# Patient Record
Sex: Male | Born: 1965 | Race: Black or African American | Hispanic: No | Marital: Single | Smoking: Current every day smoker
Health system: Southern US, Community
[De-identification: ages and names within clinical notes are randomized; demographics above are authoritative.]

## PROBLEM LIST (undated history)

## (undated) DIAGNOSIS — M109 Gout, unspecified: Secondary | ICD-10-CM

## (undated) DIAGNOSIS — I10 Essential (primary) hypertension: Secondary | ICD-10-CM

## (undated) HISTORY — PX: TOTAL HIP ARTHROPLASTY: SHX124

## (undated) HISTORY — PX: TOTAL SHOULDER REPLACEMENT: SUR1217

---

## 2018-10-03 ENCOUNTER — Emergency Department
Admission: EM | Admit: 2018-10-03 | Discharge: 2018-10-03 | Disposition: A | Discharge status: Home / Self Care | Payer: BLUE CROSS/BLUE SHIELD | Attending: Emergency Medicine | Admitting: Emergency Medicine

## 2018-10-03 ENCOUNTER — Other Ambulatory Visit: Payer: Self-pay

## 2018-10-03 ENCOUNTER — Encounter: Payer: Self-pay | Admitting: Emergency Medicine

## 2018-10-03 ENCOUNTER — Emergency Department: Payer: BLUE CROSS/BLUE SHIELD

## 2018-10-03 DIAGNOSIS — Z96643 Presence of artificial hip joint, bilateral: Secondary | ICD-10-CM | POA: Diagnosis not present

## 2018-10-03 DIAGNOSIS — Z96611 Presence of right artificial shoulder joint: Secondary | ICD-10-CM | POA: Diagnosis not present

## 2018-10-03 DIAGNOSIS — I10 Essential (primary) hypertension: Secondary | ICD-10-CM | POA: Insufficient documentation

## 2018-10-03 DIAGNOSIS — M79642 Pain in left hand: Secondary | ICD-10-CM | POA: Diagnosis present

## 2018-10-03 DIAGNOSIS — R2232 Localized swelling, mass and lump, left upper limb: Secondary | ICD-10-CM | POA: Diagnosis not present

## 2018-10-03 DIAGNOSIS — F17200 Nicotine dependence, unspecified, uncomplicated: Secondary | ICD-10-CM | POA: Insufficient documentation

## 2018-10-03 DIAGNOSIS — M7989 Other specified soft tissue disorders: Secondary | ICD-10-CM

## 2018-10-03 HISTORY — DX: Essential (primary) hypertension: I10

## 2018-10-03 LAB — CBC
HCT: 44.2 % (ref 39.0–52.0)
Hemoglobin: 13.9 g/dL (ref 13.0–17.0)
MCH: 27.9 pg (ref 26.0–34.0)
MCHC: 31.4 g/dL (ref 30.0–36.0)
MCV: 88.8 fL (ref 80.0–100.0)
Platelets: 302 10*3/uL (ref 150–400)
RBC: 4.98 MIL/uL (ref 4.22–5.81)
RDW: 13 % (ref 11.5–15.5)
WBC: 10.7 10*3/uL — AB (ref 4.0–10.5)
nRBC: 0 % (ref 0.0–0.2)

## 2018-10-03 LAB — BASIC METABOLIC PANEL
Anion gap: 8 (ref 5–15)
BUN: 15 mg/dL (ref 6–20)
CALCIUM: 9.3 mg/dL (ref 8.9–10.3)
CO2: 30 mmol/L (ref 22–32)
CREATININE: 1.04 mg/dL (ref 0.61–1.24)
Chloride: 100 mmol/L (ref 98–111)
GFR calc non Af Amer: >60 mL/min (ref >60)
Glucose, Bld: 98 mg/dL (ref 70–99)
Potassium: 3.5 mmol/L (ref 3.5–5.1)
SODIUM: 138 mmol/L (ref 135–145)

## 2018-10-03 LAB — URIC ACID: Uric Acid, Serum: 6.6 mg/dL (ref 3.7–8.6)

## 2018-10-03 MED ORDER — OXYCODONE-ACETAMINOPHEN 5-325 MG PO TABS: 1.0000 | ORAL_TABLET | ORAL | 0 refills | Status: DC | PRN

## 2018-10-03 MED ORDER — MORPHINE SULFATE (PF) 4 MG/ML IV SOLN
4.0000 mg | Freq: Once | INTRAVENOUS | Status: AC
  Administered 2018-10-03: 4 mg via INTRAVENOUS
  Filled 2018-10-03: qty 1

## 2018-10-03 MED ORDER — PREDNISONE 10 MG PO TABS: ORAL_TABLET | ORAL | 0 refills | Status: DC

## 2018-10-03 MED ORDER — DEXAMETHASONE SODIUM PHOSPHATE 10 MG/ML IJ SOLN
10.0000 mg | Freq: Once | INTRAMUSCULAR | Status: AC
  Administered 2018-10-03: 10 mg via INTRAVENOUS
  Filled 2018-10-03: qty 1

## 2018-10-03 MED ORDER — OXYCODONE-ACETAMINOPHEN 5-325 MG PO TABS
1.0000 | ORAL_TABLET | Freq: Once | ORAL | Status: AC
  Administered 2018-10-03: 1 via ORAL
  Filled 2018-10-03: qty 1

## 2018-10-03 NOTE — ED Provider Notes (Signed)
[facility] EMERGENCY DEPARTMENT Provider Note   CSN: [id number] Arrival date & time: 10/03/18  1841     History   Chief Complaint Chief Complaint  Patient presents with  . Hand Pain    HPI Thomas Tapia is a 52 y.o. male.  Presents to the emergency department evaluation of left hand pain and swelling 3 days ago.  Patient denies any trauma or injury.  No skin breakdown, wounds.  He states he has a history of gout and hypertension.  He has not been taking his gout medication, allopurinol as it has been prescribed.  Patient feels as if the left wrist and hand are very painful, hot, swollen.  No numbness or tingling.  He has tried restarting his allopurinol with no improvement.  Pain is moderate.  HPI  Past Medical History:  Diagnosis Date  . Hypertension     There are no active problems to display for this patient.   Past Surgical History:  Procedure Laterality Date  . TOTAL HIP ARTHROPLASTY Bilateral   . TOTAL SHOULDER REPLACEMENT Right         Home Medications    Prior to Admission medications   Medication Sig Start Date End Date Taking? Authorizing Provider  oxyCODONE-acetaminophen (PERCOCET) 5-325 MG tablet Take 1 tablet by mouth every 4 (four) hours as needed for severe pain. 10/03/18 10/03/19  Duanne Guess, PA-C  predniSONE (DELTASONE) 10 MG tablet 10 day taper. 5,5,4,4,3,3,2,2,1,1 10/03/18   Duanne Guess, PA-C    Family History No family history on file.  Social History Social History   Tobacco Use  . Smoking status: Current Every Day Smoker  . Smokeless tobacco: Never Used  Substance Use Topics  . Alcohol use: Not on file  . Drug use: Not on file     Allergies   Patient has no known allergies.   Review of Systems Review of Systems  Constitutional: Negative.  Negative for activity change, appetite change, chills and fever.  HENT: Negative for ear pain and trouble swallowing.   Respiratory: Negative for shortness  of breath.   Cardiovascular: Negative for chest pain.  Gastrointestinal: Negative for nausea and vomiting.  Genitourinary: Negative for decreased urine volume.  Musculoskeletal: Positive for arthralgias and joint swelling. Negative for back pain and gait problem.  Skin: Negative for color change, rash and wound.  Neurological: Negative for dizziness and headaches.  Hematological: Negative for adenopathy.  Psychiatric/Behavioral: Negative for agitation and behavioral problems.     Physical Exam Updated Vital Signs BP (!) 149/85   Pulse 96   Temp 97.6 F (36.4 C) (Oral)   Resp 18   Ht 5\' 11"  (1.803 m)   Wt 102.1 kg   SpO2 99%   BMI 31.38 kg/m   Physical Exam  Constitutional: He is oriented to person, place, and time. He appears well-developed and well-nourished.  HENT:  Head: Normocephalic and atraumatic.  Eyes: Conjunctivae are normal.  Neck: Normal range of motion.  Cardiovascular: Normal rate.  Pulmonary/Chest: Effort normal. No respiratory distress.  Musculoskeletal: Normal range of motion.  Left hand and wrist are swollen, erythematous and warm.  No skin breakdown noted.  Wrist and carpal region of the left hand are tender to palpation.  No limitations throughout left upper extremity with digit range of motion.  2+ radial pulse.  Cap refill is intact.  Patient is able to make a full fist.  There is no swelling throughout the digits.  No sign of flexor tenosynovitis.  Neurological: He is alert and oriented to person, place, and time.  Skin: Skin is warm. No rash noted.  Psychiatric: He has a normal mood and affect. His behavior is normal. Thought content normal.  Nursing note and vitals reviewed.    [place] Treatments / Results  Labs (all labs ordered are listed, but only abnormal results are displayed) Labs Reviewed  CBC - Abnormal; Notable for the following components:      Result Value   WBC 10.7 (*)    All other components within normal limits  BASIC METABOLIC PANEL   URIC ACID    EKG None  Radiology Dg Hand Complete Left  Result Date: 10/03/2018 CLINICAL DATA:  Left hand swelling and pain EXAM: LEFT HAND - COMPLETE 3+ VIEW COMPARISON:  None. FINDINGS: Moderate diffuse soft tissue swelling of the dorsum of the wrist and hand. No frank bone destruction to suggest osteomyelitis. No soft tissue mineralization or extra-articular erosive change. 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 Fixation screw traverses the fourth DIP joint. Healed fracture deformity of the head and neck of the left second metacarpal. Osteoarthritic joint space narrowing of the second MCP. Intact carpal rows and carpal bones. Distal radius and ulna are nonacute. Faint lucency at the base of the fourth proximal phalanx is seen. Although this could potentially represent a nondisplaced fracture, patient notes no known injury and therefore findings may be secondary to a trabecular marking. IMPRESSION: 1. Moderate diffuse soft tissue swelling over the dorsum of the hand and wrist without underlying fracture or bone destruction. 2. Faint nondisplaced linear lucency at the base of the fourth proximal phalanx may represent a trabecular marking in the absence of known injury. 3. Remote healed fracture deformity of the second metacarpal head and neck with associated degenerative joint space narrowing of the second MCP. 4. Fixation screw traverses an osteoarthritic fourth DIP joint. No complicating features are noted. There is mild soft tissue swelling of the ring finger however. Electronically Signed   By: Ashley Royalty M.D.   On: 10/03/2018 20:53    Procedures Procedures (including critical care time)  Medications Ordered in [facility] Medications  oxyCODONE-acetaminophen (PERCOCET/ROXICET) 5-325 MG per tablet 1 tablet (has no administration in time range)  morphine 4 MG/ML injection 4 mg (4 mg Intravenous Given 10/03/18 2037)  dexamethasone (DECADRON) injection 10 mg (10 mg  Intravenous Given 10/03/18 2153)     Initial Impression / Assessment and Plan / Fitz Course  I have reviewed the triage vital signs and the nursing notes.  Pertinent labs & imaging results that were available during my care of the patient were reviewed by me and considered in my medical decision making (see chart for details).     52 year old male with a history of gout presents to emergency department with swollen red tender left hand.  History and exam concerning for gout, uric acid within normal limits, this could be due to patient restarting his allopurinol over the last few days.  He is not running fevers and his white count is only slightly elevated.  No sign of flexor tenosynovitis, abscess formation or osteomyelitis.  History and exam is more concerning for gout's will start prednisone and give oxycodone.  He is educated on signs and symptoms return to the [facility] for such as no improvement of redness or swelling in the next 24 to 48 hours as well as any fevers or increasing pain or digit swelling.  Final Clinical Impressions(s) / Marcia Diagnoses   Final  diagnoses:  Left hand pain  Swelling of left hand    [facility] Discharge Orders         Ordered    predniSONE (DELTASONE) 10 MG tablet     10/03/18 2223    oxyCODONE-acetaminophen (PERCOCET) 5-325 MG tablet  Every 4 hours PRN     10/03/18 2223           Renata Caprice 10/03/18 2228    Orbie Pyo, MD 10/03/18 [phone]

## 2018-10-03 NOTE — Discharge Instructions (Addendum)
Please take prednisone as prescribed along with pain medicine as needed.  If any increasing pain, swelling redness or fevers return to the emergency department.

## 2018-10-03 NOTE — ED Triage Notes (Signed)
Patient ambulatory to triage with steady gait, without difficulty or distress noted; pt reports left hand swelling x 3days with no known injury; hx gout

## 2019-01-08 ENCOUNTER — Emergency Department: Payer: Medicare (Managed Care)

## 2019-01-08 ENCOUNTER — Other Ambulatory Visit: Payer: Self-pay

## 2019-01-08 ENCOUNTER — Encounter: Payer: Self-pay | Admitting: *Deleted

## 2019-01-08 ENCOUNTER — Emergency Department
Admission: EM | Admit: 2019-01-08 | Discharge: 2019-01-08 | Disposition: A | Discharge status: Home / Self Care | Payer: Medicare (Managed Care) | Attending: Emergency Medicine | Admitting: Emergency Medicine

## 2019-01-08 DIAGNOSIS — Z96643 Presence of artificial hip joint, bilateral: Secondary | ICD-10-CM | POA: Diagnosis not present

## 2019-01-08 DIAGNOSIS — M79671 Pain in right foot: Secondary | ICD-10-CM | POA: Diagnosis present

## 2019-01-08 DIAGNOSIS — L03115 Cellulitis of right lower limb: Secondary | ICD-10-CM

## 2019-01-08 DIAGNOSIS — I10 Essential (primary) hypertension: Secondary | ICD-10-CM | POA: Diagnosis not present

## 2019-01-08 DIAGNOSIS — Z79899 Other long term (current) drug therapy: Secondary | ICD-10-CM | POA: Diagnosis not present

## 2019-01-08 DIAGNOSIS — M1000 Idiopathic gout, unspecified site: Secondary | ICD-10-CM | POA: Insufficient documentation

## 2019-01-08 DIAGNOSIS — Z96611 Presence of right artificial shoulder joint: Secondary | ICD-10-CM | POA: Diagnosis not present

## 2019-01-08 DIAGNOSIS — F17210 Nicotine dependence, cigarettes, uncomplicated: Secondary | ICD-10-CM | POA: Insufficient documentation

## 2019-01-08 LAB — CBC WITH DIFFERENTIAL/PLATELET
Abs Immature Granulocytes: 0.04 10*3/uL (ref 0.00–0.07)
BASOS ABS: 0.1 10*3/uL (ref 0.0–0.1)
BASOS PCT: 1 %
EOS ABS: 0.2 10*3/uL (ref 0.0–0.5)
EOS PCT: 2 %
HCT: 41.6 % (ref 39.0–52.0)
Hemoglobin: 13.2 g/dL (ref 13.0–17.0)
Immature Granulocytes: 0 %
LYMPHS PCT: 12 %
Lymphs Abs: 1.3 10*3/uL (ref 0.7–4.0)
MCH: 27.8 pg (ref 26.0–34.0)
MCHC: 31.7 g/dL (ref 30.0–36.0)
MCV: 87.8 fL (ref 80.0–100.0)
MONO ABS: 1.5 10*3/uL — AB (ref 0.1–1.0)
Monocytes Relative: 15 %
NRBC: 0 % (ref 0.0–0.2)
Neutro Abs: 7.3 10*3/uL (ref 1.7–7.7)
Neutrophils Relative %: 70 %
PLATELETS: 304 10*3/uL (ref 150–400)
RBC: 4.74 MIL/uL (ref 4.22–5.81)
RDW: 13 % (ref 11.5–15.5)
WBC: 10.3 10*3/uL (ref 4.0–10.5)

## 2019-01-08 LAB — COMPREHENSIVE METABOLIC PANEL
ALT: 85 U/L — AB (ref 0–44)
AST: 58 U/L — ABNORMAL HIGH (ref 15–41)
Albumin: 3.9 g/dL (ref 3.5–5.0)
Alkaline Phosphatase: 72 U/L (ref 38–126)
Anion gap: 8 (ref 5–15)
BILIRUBIN TOTAL: 0.8 mg/dL (ref 0.3–1.2)
BUN: 14 mg/dL (ref 6–20)
CHLORIDE: 97 mmol/L — AB (ref 98–111)
CO2: 28 mmol/L (ref 22–32)
CREATININE: 1.03 mg/dL (ref 0.61–1.24)
Calcium: 8.9 mg/dL (ref 8.9–10.3)
Glucose, Bld: 117 mg/dL — ABNORMAL HIGH (ref 70–99)
Potassium: 3.4 mmol/L — ABNORMAL LOW (ref 3.5–5.1)
Sodium: 133 mmol/L — ABNORMAL LOW (ref 135–145)
TOTAL PROTEIN: 8.1 g/dL (ref 6.5–8.1)

## 2019-01-08 LAB — URIC ACID: URIC ACID, SERUM: 7.7 mg/dL (ref 3.7–8.6)

## 2019-01-08 MED ORDER — MORPHINE SULFATE (PF) 4 MG/ML IV SOLN
4.0000 mg | Freq: Once | INTRAVENOUS | Status: AC
  Administered 2019-01-08: 4 mg via INTRAVENOUS
  Filled 2019-01-08: qty 1

## 2019-01-08 MED ORDER — SULFAMETHOXAZOLE-TRIMETHOPRIM 800-160 MG PO TABS: 1.0000 | ORAL_TABLET | Freq: Two times a day (BID) | ORAL | 0 refills | Status: DC

## 2019-01-08 MED ORDER — HYDROCODONE-ACETAMINOPHEN 5-325 MG PO TABS: 1.0000 | ORAL_TABLET | Freq: Four times a day (QID) | ORAL | 0 refills | Status: DC | PRN

## 2019-01-08 MED ORDER — DEXAMETHASONE SODIUM PHOSPHATE 10 MG/ML IJ SOLN
10.0000 mg | Freq: Once | INTRAMUSCULAR | Status: AC
  Administered 2019-01-08: 10 mg via INTRAVENOUS
  Filled 2019-01-08: qty 1

## 2019-01-08 MED ORDER — PREDNISONE 10 MG (21) PO TBPK: ORAL_TABLET | ORAL | 0 refills | Status: DC

## 2019-01-08 MED ORDER — CEPHALEXIN 500 MG PO CAPS: 500.0000 mg | ORAL_CAPSULE | Freq: Three times a day (TID) | ORAL | 0 refills | Status: DC

## 2019-01-08 MED ORDER — PIPERACILLIN-TAZOBACTAM 3.375 G IVPB 30 MIN
3.3750 g | Freq: Once | INTRAVENOUS | Status: AC
  Administered 2019-01-08: 3.375 g via INTRAVENOUS
  Filled 2019-01-08: qty 50

## 2019-01-08 MED ORDER — COLCHICINE 0.6 MG PO TABS: 0.6000 mg | ORAL_TABLET | Freq: Every day | ORAL | 2 refills | Status: DC

## 2019-01-08 MED ORDER — ONDANSETRON HCL 4 MG/2ML IJ SOLN
4.0000 mg | Freq: Once | INTRAMUSCULAR | Status: AC
  Administered 2019-01-08: 4 mg via INTRAVENOUS
  Filled 2019-01-08: qty 2

## 2019-01-08 NOTE — ED Provider Notes (Signed)
[facility] Emergency Department Provider Note  ____________________________________________   First MD Initiated Contact with Patient 01/08/19 1343     (approximate)  I have reviewed the triage vital signs and the nursing notes.   HISTORY  Chief Complaint Foot Pain    HPI Thomas Tapia is a 53 y.o. male presents emergency department complaint of right foot pain.  He has history of gout.  He recently has been drinking a lot of beer and eating sausages.  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  He states that he had to have surgery on the foot the last time it got infected.  He denies fever chills this time.    Past Medical History:  Diagnosis Date  . Hypertension     There are no active problems to display for this patient.   Past Surgical History:  Procedure Laterality Date  . TOTAL HIP ARTHROPLASTY Bilateral   . TOTAL SHOULDER REPLACEMENT Right     Prior to Admission medications   Medication Sig Start Date End Date Taking? Authorizing Provider  cephALEXin (KEFLEX) 500 MG capsule Take 1 capsule (500 mg total) by mouth 3 (three) times daily. 01/08/19   Fisher, Linden Dolin, PA-C  colchicine 0.6 MG tablet Take 1 tablet (0.6 mg total) by mouth daily. 01/08/19 01/08/20  Fisher, Linden Dolin, PA-C  HYDROcodone-acetaminophen (NORCO/VICODIN) 5-325 MG tablet Take 1 tablet by mouth every 6 (six) hours as needed for moderate pain. 01/08/19   Fisher, Linden Dolin, PA-C  oxyCODONE-acetaminophen (PERCOCET) 5-325 MG tablet Take 1 tablet by mouth every 4 (four) hours as needed for severe pain. 10/03/18 10/03/19  Duanne Guess, PA-C  predniSONE (STERAPRED UNI-PAK 21 TAB) 10 MG (21) TBPK tablet Take 6 pills on day one then decrease by 1 pill each day 01/08/19   Versie Starks, PA-C  sulfamethoxazole-trimethoprim (BACTRIM DS,SEPTRA DS) 800-160 MG tablet Take 1 tablet by mouth 2 (two) times daily. 01/08/19   Versie Starks, PA-C    Allergies Patient has no known  allergies.  History reviewed. No pertinent family history.  Social History Social History   Tobacco Use  . Smoking status: Current Every Day Smoker  . Smokeless tobacco: Never Used  Substance Use Topics  . Alcohol use: Not on file  . Drug use: Not on file    Review of Systems  Constitutional: No fever/chills Eyes: No visual changes. ENT: No sore throat. Respiratory: Denies cough Genitourinary: Negative for dysuria. Musculoskeletal: Negative for back pain.  Right foot pain Skin: Negative for rash.    ____________________________________________   PHYSICAL EXAM:  VITAL SIGNS: [place] Triage Vitals  Enc Vitals Group     BP 01/08/19 1252 (!) 164/91     Pulse Rate 01/08/19 1252 92     Resp 01/08/19 1252 14     Temp 01/08/19 1252 97.7 F (36.5 C)     Temp Source 01/08/19 1252 Oral     SpO2 01/08/19 1252 92 %     Weight 01/08/19 1252 225 lb (102.1 kg)     Height 01/08/19 1252 5\' 11"  (1.803 m)     Head Circumference --      Peak Flow --      Pain Score 01/08/19 1258 10     Pain Loc --      Pain Edu? --      Excl. in [place]? --     Constitutional: Alert and oriented. Well appearing and in no acute distress. Eyes: Conjunctivae  are normal.  Head: Atraumatic. Nose: No congestion/rhinnorhea. Mouth/Throat: Mucous membranes are moist.   Neck:  supple no lymphadenopathy noted Cardiovascular: Normal rate, regular rhythm. Heart sounds are normal Respiratory: Normal respiratory effort.  No retractions, lungs c t a  GU: deferred Musculoskeletal: FROM all extremities, warm and well perfused, the right foot is grossly red and swollen, tender to palpation.  Neurovascular is intact.  The skin on the plantar surface has tinea, the area does appear to have broken skin. Neurologic:  Normal speech and language.  Skin:  Skin is warm, dry . No rash noted. Psychiatric: Mood and affect are normal. Speech and behavior are normal.  ____________________________________________   LABS (all  labs ordered are listed, but only abnormal results are displayed)  Labs Reviewed  COMPREHENSIVE METABOLIC PANEL - Abnormal; Notable for the following components:      Result Value   Sodium 133 (*)    Potassium 3.4 (*)    Chloride 97 (*)    Glucose, Bld 117 (*)    AST 58 (*)    ALT 85 (*)    All other components within normal limits  CBC WITH DIFFERENTIAL/PLATELET - Abnormal; Notable for the following components:   Monocytes Absolute 1.5 (*)    All other components within normal limits  URIC ACID   ____________________________________________   ____________________________________________  RADIOLOGY  X-ray of the right foot is negative  ____________________________________________   PROCEDURES  Procedure(s) performed: Saline lock, morphine 4 mg IV, Zofran 4 mg IV, Decadron 10 mg IV, Zosyn IV  Procedures    ____________________________________________   INITIAL IMPRESSION / ASSESSMENT AND PLAN / Yvan COURSE  Pertinent labs & imaging results that were available during my care of the patient were reviewed by me and considered in my medical decision making (see chart for details).   Patient is a 53 year old male presents emergency department complaint of right foot pain.  Physical exam shows a red swollen tender foot.  Labs for CBC, uric acid, comprehensive metabolic panel are basically normal.  However he does have slightly elevated liver enzymes which I would attribute to his alcohol intake.  X-ray of the right foot  Due to the patient's history of MRSA he was given Zosyn IV.  He had originally been given Decadron 10 mg IV, morphine 4 mg IV and Zofran 4 mg IV.  States pain medication has helped with his pain.  Explained all of the labs and x-ray results to the patient.   He is given a prescription for Septra/Keflex for the cellulitis, colchicine for his chronic gout, Sterapred, and Vicodin.  He is to follow-up with his regular doctor if not better in 3 to 5 days.   Return emergency department worsening.  States he understands will comply.  He was discharged in stable condition.  As part of my medical decision making, I reviewed the following data within the [place] notes reviewed and incorporated, Labs reviewed CBC is normal, uric acid is normal, comprehensive metabolic panel has some elevated liver enzymes but is otherwise normal, Old chart reviewed, Radiograph reviewed x-ray of the right foot is negative for osteomyelitis, Notes from prior [place] visits and [place] Controlled Substance Database  ____________________________________________   FINAL CLINICAL IMPRESSION(S) / Darold DIAGNOSES  Final diagnoses:  Cellulitis of right lower extremity  Acute idiopathic gout, unspecified site      NEW MEDICATIONS STARTED DURING THIS VISIT:  New Prescriptions   CEPHALEXIN (KEFLEX) 500 MG CAPSULE    Take 1 capsule (  500 mg total) by mouth 3 (three) times daily.   COLCHICINE 0.6 MG TABLET    Take 1 tablet (0.6 mg total) by mouth daily.   HYDROCODONE-ACETAMINOPHEN (NORCO/VICODIN) 5-325 MG TABLET    Take 1 tablet by mouth every 6 (six) hours as needed for moderate pain.   PREDNISONE (STERAPRED UNI-PAK 21 TAB) 10 MG (21) TBPK TABLET    Take 6 pills on day one then decrease by 1 pill each day   SULFAMETHOXAZOLE-TRIMETHOPRIM (BACTRIM DS,SEPTRA DS) 800-160 MG TABLET    Take 1 tablet by mouth 2 (two) times daily.     Note:  This document was prepared using Dragon voice recognition software and may include unintentional dictation errors.    Versie Starks, PA-C 01/08/19 1552    Nance Pear, MD 01/09/19 [phone]

## 2019-01-08 NOTE — Discharge Instructions (Addendum)
After if not better in 3 to 5 days.  Return emergency department worsening.  Take medications as prescribed.  You are being treated for cellulitis and gout.  You should change her lifestyle which she did not drink as much alcohol and do not eat ground meat.  Drink plenty of water to help flush this through your system.  Return if worsening

## 2019-01-08 NOTE — ED Triage Notes (Signed)
Pt to Thomas Tapia reporting right foot pain since Monday. Hx of Gout but pt reporting this feels worse than before. No new injury. Pain worsens when pt applies pressure to the right foot.

## 2019-04-24 ENCOUNTER — Inpatient Hospital Stay
Admission: EM | Admit: 2019-04-24 | Discharge: 2019-04-26 | DRG: 683 | Disposition: A | Discharge status: Home / Self Care | Payer: Medicare (Managed Care) | Attending: Internal Medicine | Admitting: Internal Medicine

## 2019-04-24 ENCOUNTER — Encounter: Payer: Self-pay | Admitting: Emergency Medicine

## 2019-04-24 ENCOUNTER — Emergency Department: Payer: Medicare (Managed Care)

## 2019-04-24 ENCOUNTER — Other Ambulatory Visit: Payer: Self-pay

## 2019-04-24 DIAGNOSIS — M109 Gout, unspecified: Secondary | ICD-10-CM | POA: Diagnosis not present

## 2019-04-24 DIAGNOSIS — Z1159 Encounter for screening for other viral diseases: Secondary | ICD-10-CM | POA: Diagnosis not present

## 2019-04-24 DIAGNOSIS — E860 Dehydration: Secondary | ICD-10-CM | POA: Diagnosis present

## 2019-04-24 DIAGNOSIS — F17200 Nicotine dependence, unspecified, uncomplicated: Secondary | ICD-10-CM | POA: Diagnosis present

## 2019-04-24 DIAGNOSIS — Z8249 Family history of ischemic heart disease and other diseases of the circulatory system: Secondary | ICD-10-CM

## 2019-04-24 DIAGNOSIS — I471 Supraventricular tachycardia: Secondary | ICD-10-CM | POA: Diagnosis present

## 2019-04-24 DIAGNOSIS — I10 Essential (primary) hypertension: Secondary | ICD-10-CM | POA: Diagnosis present

## 2019-04-24 DIAGNOSIS — I160 Hypertensive urgency: Secondary | ICD-10-CM | POA: Diagnosis present

## 2019-04-24 DIAGNOSIS — M6282 Rhabdomyolysis: Secondary | ICD-10-CM | POA: Diagnosis present

## 2019-04-24 DIAGNOSIS — N179 Acute kidney failure, unspecified: Secondary | ICD-10-CM | POA: Diagnosis not present

## 2019-04-24 DIAGNOSIS — F10239 Alcohol dependence with withdrawal, unspecified: Secondary | ICD-10-CM | POA: Diagnosis present

## 2019-04-24 DIAGNOSIS — F10939 Alcohol use, unspecified with withdrawal, unspecified: Secondary | ICD-10-CM

## 2019-04-24 DIAGNOSIS — Z96611 Presence of right artificial shoulder joint: Secondary | ICD-10-CM | POA: Diagnosis present

## 2019-04-24 DIAGNOSIS — Z96643 Presence of artificial hip joint, bilateral: Secondary | ICD-10-CM | POA: Diagnosis present

## 2019-04-24 HISTORY — DX: Gout, unspecified: M10.9

## 2019-04-24 LAB — CBC
HCT: 45 % (ref 39.0–52.0)
Hemoglobin: 15 g/dL (ref 13.0–17.0)
MCH: 28.6 pg (ref 26.0–34.0)
MCHC: 33.3 g/dL (ref 30.0–36.0)
MCV: 85.9 fL (ref 80.0–100.0)
Platelets: 231 10*3/uL (ref 150–400)
RBC: 5.24 MIL/uL (ref 4.22–5.81)
RDW: 13.3 % (ref 11.5–15.5)
WBC: 10 10*3/uL (ref 4.0–10.5)
nRBC: 0 % (ref 0.0–0.2)

## 2019-04-24 LAB — COMPREHENSIVE METABOLIC PANEL
ALT: 42 U/L (ref 0–44)
AST: 54 U/L — ABNORMAL HIGH (ref 15–41)
Albumin: 4.5 g/dL (ref 3.5–5.0)
Alkaline Phosphatase: 65 U/L (ref 38–126)
Anion gap: 15 (ref 5–15)
BUN: 19 mg/dL (ref 6–20)
CO2: 25 mmol/L (ref 22–32)
Calcium: 9.1 mg/dL (ref 8.9–10.3)
Chloride: 89 mmol/L — ABNORMAL LOW (ref 98–111)
Creatinine, Ser: 2.1 mg/dL — ABNORMAL HIGH (ref 0.61–1.24)
GFR calc Af Amer: 40 mL/min — ABNORMAL LOW (ref >60)
GFR calc non Af Amer: 35 mL/min — ABNORMAL LOW (ref >60)
Glucose, Bld: 115 mg/dL — ABNORMAL HIGH (ref 70–99)
Potassium: 3.2 mmol/L — ABNORMAL LOW (ref 3.5–5.1)
Sodium: 129 mmol/L — ABNORMAL LOW (ref 135–145)
Total Bilirubin: 1.6 mg/dL — ABNORMAL HIGH (ref 0.3–1.2)
Total Protein: 9.7 g/dL — ABNORMAL HIGH (ref 6.5–8.1)

## 2019-04-24 LAB — URIC ACID: Uric Acid, Serum: 12.7 mg/dL — ABNORMAL HIGH (ref 3.7–8.6)

## 2019-04-24 LAB — CK: Total CK: 560 U/L — ABNORMAL HIGH (ref 49–397)

## 2019-04-24 LAB — SARS CORONAVIRUS 2 BY RT PCR (HOSPITAL ORDER, PERFORMED IN ~~LOC~~ HOSPITAL LAB): SARS Coronavirus 2: NEGATIVE

## 2019-04-24 LAB — TROPONIN I: Troponin I: <0.03 ng/mL (ref <0.03)

## 2019-04-24 MED ORDER — PREDNISONE 50 MG PO TABS
50.0000 mg | ORAL_TABLET | Freq: Every day | ORAL | Status: DC
  Administered 2019-04-25 – 2019-04-26 (×2): 50 mg via ORAL
  Filled 2019-04-24 (×2): qty 1

## 2019-04-24 MED ORDER — ADENOSINE 6 MG/2ML IV SOLN
6.0000 mg | Freq: Once | INTRAVENOUS | Status: AC
  Administered 2019-04-24: 6 mg via INTRAVENOUS

## 2019-04-24 MED ORDER — MORPHINE SULFATE (PF) 4 MG/ML IV SOLN
4.0000 mg | INTRAVENOUS | Status: DC | PRN
  Filled 2019-04-24: qty 1

## 2019-04-24 MED ORDER — THIAMINE HCL 100 MG/ML IJ SOLN
100.0000 mg | Freq: Every day | INTRAMUSCULAR | Status: DC
  Administered 2019-04-24: 100 mg via INTRAVENOUS
  Filled 2019-04-24: qty 2

## 2019-04-24 MED ORDER — NICOTINE 21 MG/24HR TD PT24
21.0000 mg | MEDICATED_PATCH | Freq: Once | TRANSDERMAL | Status: AC
  Administered 2019-04-24: 21 mg via TRANSDERMAL
  Filled 2019-04-24: qty 1

## 2019-04-24 MED ORDER — ONDANSETRON HCL 4 MG/2ML IJ SOLN
4.0000 mg | Freq: Once | INTRAMUSCULAR | Status: AC
  Administered 2019-04-24: 4 mg via INTRAVENOUS
  Filled 2019-04-24: qty 2

## 2019-04-24 MED ORDER — FOLIC ACID 1 MG PO TABS
1.0000 mg | ORAL_TABLET | Freq: Every day | ORAL | Status: DC
  Administered 2019-04-24 – 2019-04-26 (×3): 1 mg via ORAL
  Filled 2019-04-24 (×3): qty 1

## 2019-04-24 MED ORDER — HYDROCODONE-ACETAMINOPHEN 5-325 MG PO TABS
1.0000 | ORAL_TABLET | ORAL | Status: DC | PRN
  Administered 2019-04-24 – 2019-04-25 (×5): 2 via ORAL
  Filled 2019-04-24 (×5): qty 2

## 2019-04-24 MED ORDER — METOPROLOL TARTRATE 5 MG/5ML IV SOLN
INTRAVENOUS | Status: AC
  Administered 2019-04-24: 5 mg via INTRAVENOUS
  Filled 2019-04-24: qty 5

## 2019-04-24 MED ORDER — LORAZEPAM 2 MG PO TABS: 0.0000 mg | ORAL_TABLET | Freq: Two times a day (BID) | ORAL | Status: DC

## 2019-04-24 MED ORDER — MAGNESIUM SULFATE 2 GM/50ML IV SOLN
2.0000 g | Freq: Once | INTRAVENOUS | Status: AC
  Administered 2019-04-24: 11:00:00 2 g via INTRAVENOUS

## 2019-04-24 MED ORDER — ADENOSINE 6 MG/2ML IV SOLN
INTRAVENOUS | Status: AC
  Administered 2019-04-24: 6 mg via INTRAVENOUS
  Filled 2019-04-24: qty 2

## 2019-04-24 MED ORDER — LORAZEPAM 2 MG/ML IJ SOLN: 1.0000 mg | Freq: Four times a day (QID) | INTRAMUSCULAR | Status: DC | PRN

## 2019-04-24 MED ORDER — SODIUM CHLORIDE 0.9 % IV BOLUS
500.0000 mL | Freq: Once | INTRAVENOUS | Status: AC
  Administered 2019-04-24: 500 mL via INTRAVENOUS

## 2019-04-24 MED ORDER — ACETAMINOPHEN 325 MG PO TABS: 650.0000 mg | ORAL_TABLET | Freq: Four times a day (QID) | ORAL | Status: DC | PRN

## 2019-04-24 MED ORDER — HEPARIN SODIUM (PORCINE) 5000 UNIT/ML IJ SOLN
5000.0000 [IU] | Freq: Three times a day (TID) | INTRAMUSCULAR | Status: DC
  Administered 2019-04-24 – 2019-04-26 (×6): 5000 [IU] via SUBCUTANEOUS
  Filled 2019-04-24 (×7): qty 1

## 2019-04-24 MED ORDER — SODIUM CHLORIDE 0.9 % IV SOLN
INTRAVENOUS | Status: DC
  Administered 2019-04-24 – 2019-04-25 (×3): via INTRAVENOUS

## 2019-04-24 MED ORDER — PREDNISONE 20 MG PO TABS: 50.0000 mg | ORAL_TABLET | Freq: Every day | ORAL | Status: DC

## 2019-04-24 MED ORDER — MAGNESIUM SULFATE 2 GM/50ML IV SOLN
INTRAVENOUS | Status: AC
  Administered 2019-04-24: 2 g via INTRAVENOUS
  Filled 2019-04-24: qty 50

## 2019-04-24 MED ORDER — ADENOSINE 12 MG/4ML IV SOLN
INTRAVENOUS | Status: AC
  Administered 2019-04-24: 10:00:00
  Filled 2019-04-24: qty 4

## 2019-04-24 MED ORDER — ADULT MULTIVITAMIN W/MINERALS CH
1.0000 | ORAL_TABLET | Freq: Every day | ORAL | Status: DC
  Administered 2019-04-24 – 2019-04-26 (×3): 1 via ORAL
  Filled 2019-04-24 (×3): qty 1

## 2019-04-24 MED ORDER — MORPHINE SULFATE (PF) 4 MG/ML IV SOLN
4.0000 mg | INTRAVENOUS | Status: DC | PRN
  Administered 2019-04-24: 4 mg via INTRAVENOUS

## 2019-04-24 MED ORDER — POTASSIUM CHLORIDE CRYS ER 20 MEQ PO TBCR
40.0000 meq | EXTENDED_RELEASE_TABLET | ORAL | Status: AC
  Administered 2019-04-24 (×2): 40 meq via ORAL
  Filled 2019-04-24 (×2): qty 2

## 2019-04-24 MED ORDER — LORAZEPAM 1 MG PO TABS: 1.0000 mg | ORAL_TABLET | Freq: Four times a day (QID) | ORAL | Status: DC | PRN

## 2019-04-24 MED ORDER — ONDANSETRON HCL 4 MG/2ML IJ SOLN: 4.0000 mg | Freq: Four times a day (QID) | INTRAMUSCULAR | Status: DC | PRN

## 2019-04-24 MED ORDER — LORAZEPAM 2 MG PO TABS: 0.0000 mg | ORAL_TABLET | Freq: Four times a day (QID) | ORAL | Status: DC

## 2019-04-24 MED ORDER — LORAZEPAM 2 MG/ML IJ SOLN
0.0000 mg | Freq: Four times a day (QID) | INTRAMUSCULAR | Status: DC
  Administered 2019-04-24: 1 mg via INTRAVENOUS
  Filled 2019-04-24: qty 1

## 2019-04-24 MED ORDER — METOPROLOL TARTRATE 5 MG/5ML IV SOLN
5.0000 mg | Freq: Once | INTRAVENOUS | Status: AC
  Administered 2019-04-24: 5 mg via INTRAVENOUS

## 2019-04-24 MED ORDER — MORPHINE SULFATE (PF) 2 MG/ML IV SOLN
2.0000 mg | Freq: Once | INTRAVENOUS | Status: AC
  Administered 2019-04-25: 2 mg via INTRAVENOUS
  Filled 2019-04-24: qty 1

## 2019-04-24 MED ORDER — THIAMINE HCL 100 MG/ML IJ SOLN
100.0000 mg | Freq: Every day | INTRAMUSCULAR | Status: DC
  Filled 2019-04-24: qty 2

## 2019-04-24 MED ORDER — ONDANSETRON HCL 4 MG PO TABS: 4.0000 mg | ORAL_TABLET | Freq: Four times a day (QID) | ORAL | Status: DC | PRN

## 2019-04-24 MED ORDER — LORAZEPAM 2 MG/ML IJ SOLN: 0.0000 mg | Freq: Two times a day (BID) | INTRAMUSCULAR | Status: DC

## 2019-04-24 MED ORDER — VITAMIN B-1 100 MG PO TABS: 100.0000 mg | ORAL_TABLET | Freq: Every day | ORAL | Status: DC

## 2019-04-24 MED ORDER — ACETAMINOPHEN 650 MG RE SUPP: 650.0000 mg | Freq: Four times a day (QID) | RECTAL | Status: DC | PRN

## 2019-04-24 MED ORDER — VITAMIN B-1 100 MG PO TABS
100.0000 mg | ORAL_TABLET | Freq: Every day | ORAL | Status: DC
  Administered 2019-04-24 – 2019-04-26 (×3): 100 mg via ORAL
  Filled 2019-04-24 (×3): qty 1

## 2019-04-24 NOTE — ED Provider Notes (Signed)
[facility] Emergency Department Provider Note  ____________________________________________  Time seen: Approximately 7:57 AM  I have reviewed the triage vital signs and the nursing notes.   HISTORY  Chief Complaint Gout    HPI Thomas Tapia is a 53 y.o. male with PMH HTN and gout that presents to the emergency department for evaluation of pain to bilateral hands and feet for several days.  Patient states that he is having aches and cramps in his arms and legs as well.  He states that he has been drinking a lot of alcohol recently and thinks he may be dehydrated. He has been trying to drink more water the last 2 days. He is worried that he has gout. He has a history of hypertension but has not had blood pressure medication in "a while." No headache, shortness breath, chest pain, abdominal pain.   Past Medical History:  Diagnosis Date  . Gout   . Hypertension     There are no active problems to display for this patient.   Past Surgical History:  Procedure Laterality Date  . TOTAL HIP ARTHROPLASTY Bilateral   . TOTAL SHOULDER REPLACEMENT Right     Prior to Admission medications   Medication Sig Start Date End Date Taking? Authorizing Provider  cephALEXin (KEFLEX) 500 MG capsule Take 1 capsule (500 mg total) by mouth 3 (three) times daily. 01/08/19   Fisher, Linden Dolin, PA-C  colchicine 0.6 MG tablet Take 1 tablet (0.6 mg total) by mouth daily. 01/08/19 01/08/20  Fisher, Linden Dolin, PA-C  HYDROcodone-acetaminophen (NORCO/VICODIN) 5-325 MG tablet Take 1 tablet by mouth every 6 (six) hours as needed for moderate pain. 01/08/19   Fisher, Linden Dolin, PA-C  oxyCODONE-acetaminophen (PERCOCET) 5-325 MG tablet Take 1 tablet by mouth every 4 (four) hours as needed for severe pain. 10/03/18 10/03/19  Duanne Guess, PA-C  predniSONE (STERAPRED UNI-PAK 21 TAB) 10 MG (21) TBPK tablet Take 6 pills on day one then decrease by 1 pill each day 01/08/19   Versie Starks, PA-C   sulfamethoxazole-trimethoprim (BACTRIM DS,SEPTRA DS) 800-160 MG tablet Take 1 tablet by mouth 2 (two) times daily. 01/08/19   Versie Starks, PA-C    Allergies Patient has no known allergies.  No family history on file.  Social History Social History   Tobacco Use  . Smoking status: Current Every Day Smoker  . Smokeless tobacco: Never Used  Substance Use Topics  . Alcohol use: Not on file  . Drug use: Not on file     Review of Systems  Constitutional: No fever/chills Cardiovascular: No chest pain. Respiratory: No SOB. Gastrointestinal: No abdominal pain.  No nausea, no vomiting.  Musculoskeletal: Positive for upper and lower extremity pain. Skin: Negative for rash, abrasions, lacerations, ecchymosis. Neurological: Negative for headaches, numbness or tingling   ____________________________________________   PHYSICAL EXAM:  VITAL SIGNS: [place] Triage Vitals  Enc Vitals Group     BP 04/24/19 0732 (!) 184/102     Pulse Rate 04/24/19 0732 (!) 106     Resp 04/24/19 0732 16     Temp 04/24/19 0732 98.3 F (36.8 C)     Temp Source 04/24/19 0732 Oral     SpO2 04/24/19 0732 98 %     Weight 04/24/19 0720 215 lb (97.5 kg)     Height 04/24/19 0720 5\' 11"  (1.803 m)     Head Circumference --      Peak Flow --      Pain Score 04/24/19 0722 9  Pain Loc --      Pain Edu? --      Excl. in [place]? --      Constitutional: Alert and oriented. Well appearing and in no acute distress. Eyes: Conjunctivae are normal. PERRL. EOMI. Head: Atraumatic. ENT:      Ears:      Nose: No congestion/rhinnorhea.      Mouth/Throat: Mucous membranes are moist.  Neck: No stridor.  Cardiovascular: Tachycardic rate, regular rhythm.  Good peripheral circulation. Respiratory: Normal respiratory effort without tachypnea or retractions. Lungs CTAB. Good air entry to the bases with no decreased or absent breath sounds. Musculoskeletal: Full range of motion to all extremities. No gross deformities  appreciated.  No visible swelling or erythema to bilateral hands or feet. Neurologic:  Normal speech and language. No gross focal neurologic deficits are appreciated.  Skin:  Skin is warm, dry and intact. No rash noted. Psychiatric: Mood and affect are normal. Speech and behavior are normal. Patient exhibits appropriate insight and judgement.   ____________________________________________   LABS (all labs ordered are listed, but only abnormal results are displayed)  Labs Reviewed  COMPREHENSIVE METABOLIC PANEL - Abnormal; Notable for the following components:      Result Value   Sodium 129 (*)    Potassium 3.2 (*)    Chloride 89 (*)    Glucose, Bld 115 (*)    Creatinine, Ser 2.10 (*)    Total Protein 9.7 (*)    AST 54 (*)    Total Bilirubin 1.6 (*)    GFR calc non Af Amer 35 (*)    GFR calc Af Amer 40 (*)    All other components within normal limits  URIC ACID - Abnormal; Notable for the following components:   Uric Acid, Serum 12.7 (*)    All other components within normal limits  CK - Abnormal; Notable for the following components:   Total CK 560 (*)    All other components within normal limits  SARS CORONAVIRUS 2 (HOSPITAL ORDER, [place] [facility])  CBC  TROPONIN I   ____________________________________________  EKG  ST ____________________________________________  RADIOLOGY   Dg Chest Portable 1 View  Result Date: 04/24/2019 CLINICAL DATA:  Chest pain and shortness of breath. EXAM: PORTABLE CHEST 1 VIEW COMPARISON:  None. FINDINGS: Normal cardiac silhouette and mediastinal contours. Pacer pads overlie the left ventricular apex and left midclavicular line. No discrete focal airspace opacities. No pleural effusion or pneumothorax. No evidence of edema. Post right hemi humeral arthroplasty, incompletely evaluated. IMPRESSION: No acute cardiopulmonary disease on this AP portable examination. Electronically Signed   By: Sandi Mariscal M.D.   On:  04/24/2019 10:20    ____________________________________________    PROCEDURES  Procedure(s) performed:    Procedures   ____________________________________________   INITIAL IMPRESSION / ASSESSMENT AND PLAN / Linh COURSE  Pertinent labs & imaging results that were available during my care of the patient were reviewed by me and considered in my medical decision making (see chart for details).  Review of the [place] CSRS was performed in accordance of the [place] prior to dispensing any controlled drugs.    Patient presented the emergency department for evaluation of body aches.  Initial lab work was ordered to evaluate kidney and liver function and electrolytes for cause of body aches.  Patient's blood pressure is high and he has been out of medication for some time.  While in the emergency department, patient became tachycardic to 160, and he was transferred to  main side [facility] for further management.  Report was given to Dr. Quentin Cornwall.    ____________________________________________  FINAL CLINICAL IMPRESSION(S) / Nadeem DIAGNOSES  Final diagnoses:  None      NEW MEDICATIONS STARTED DURING THIS VISIT:  [place] Discharge Orders    None          This chart was dictated using voice recognition software/Dragon. Despite best efforts to proofread, errors can occur which can change the meaning. Any change was purely unintentional.    Laban Emperor, PA-C 04/24/19 1441    Merlyn Lot, MD 04/24/19 [phone]

## 2019-04-24 NOTE — Progress Notes (Signed)
Pt complaining he is not getting any pain relief from the norco he has received. Spoke with Gardiner Barefoot, NP-- she gave a one time order for 2mg  IV morphine. Will give and continue to monitor.  Conley Simmonds, RN, BSN

## 2019-04-24 NOTE — ED Notes (Signed)
[place] TO INPATIENT HANDOFF REPORT  [place] Nurse Name and Phone #:  Yesika Rispoli/Maureen, RN [phone]  S Name/Age/Gender Thomas Tapia 53 y.o. male Room/Bed: ED10A/ED10A  Code Status   Code Status: Full Code  Home/SNF/Other Home Patient oriented to: self, place, time and situation Is this baseline? Yes   Triage Complete: Triage complete  Chief Complaint gout  Triage Note Patient reports pain in hands and feet that feel similar to past gout pain.  Patient states, "my gout is flaring up."  Patient denies any other symptoms.     Allergies No Known Allergies  Level of Care/Admitting Diagnosis [place] Disposition    [place] Disposition Condition L[facility] Hospital Area: [place] [[id number]]  Level of Care: Telemetry [5]  Covid Evaluation: N/A  Diagnosis: Acute renal failure (ARF) [facility]) [[id number]]  Admitting Physician: Dustin Flock [[id number]]  Attending Physician: Dustin Flock [phone]  Estimated length of stay: past midnight tomorrow  Certification:: I certify this patient will need inpatient services for at least 2 midnights  PT Class (Do Not Modify): Inpatient [101]  PT Acc Code (Do Not Modify): Private [1]       B Medical/Surgery History Past Medical History:  Diagnosis Date  . Gout   . Hypertension    Past Surgical History:  Procedure Laterality Date  . TOTAL HIP ARTHROPLASTY Bilateral   . TOTAL SHOULDER REPLACEMENT Right      A IV Location/Drains/Wounds Patient Lines/Drains/Airways Status   Active Line/Drains/Airways    Name:   Placement date:   Placement time:   Site:   Days:   Peripheral IV 04/24/19 Left Antecubital   04/24/19    0833    Antecubital   less than 1   Peripheral IV 04/24/19 Right Antecubital   04/24/19    0958    Antecubital   less than 1          Intake/Output Last 24 hours  Intake/Output Summary (Last 24 hours) at 04/24/2019 1324 Last data filed at 04/24/2019 1105 Gross per 24 hour  Intake 550 ml  Output -  Net 550 ml     Labs/Imaging Results for orders placed or performed during the hospital encounter of 04/24/19 (from the past 48 hour(s))  CBC     Status: None   Collection Time: 04/24/19  8:17 AM  Result Value Ref Range   WBC 10.0 4.0 - 10.5 K/uL   RBC 5.24 4.22 - 5.81 MIL/uL   Hemoglobin 15.0 13.0 - 17.0 g/dL   HCT 45.0 39.0 - 52.0 %   MCV 85.9 80.0 - 100.0 fL   MCH 28.6 26.0 - 34.0 pg   MCHC 33.3 30.0 - 36.0 g/dL   RDW 13.3 11.5 - 15.5 %   Platelets 231 150 - 400 K/uL   nRBC 0.0 0.0 - 0.2 %    Comment: Performed at [facility], [address] [place], [place] [address]  Comprehensive metabolic panel     Status: Abnormal   Collection Time: 04/24/19  8:17 AM  Result Value Ref Range   Sodium 129 (L) 135 - 145 mmol/L   Potassium 3.2 (L) 3.5 - 5.1 mmol/L   Chloride 89 (L) 98 - 111 mmol/L   CO2 25 22 - 32 mmol/L   Glucose, Bld 115 (H) 70 - 99 mg/dL   BUN 19 6 - 20 mg/dL   Creatinine, Ser 2.10 (H) 0.61 - 1.24 mg/dL   Calcium 9.1 8.9 - 10.3 mg/dL   Total Protein 9.7 (H)  6.5 - 8.1 g/dL   Albumin 4.5 3.5 - 5.0 g/dL   AST 54 (H) 15 - 41 U/L   ALT 42 0 - 44 U/L   Alkaline Phosphatase 65 38 - 126 U/L   Total Bilirubin 1.6 (H) 0.3 - 1.2 mg/dL   GFR calc non Af Amer 35 (L) >60 mL/min   GFR calc Af Amer 40 (L) >60 mL/min   Anion gap 15 5 - 15    Comment: Performed at [facility], [address] [place], [place] [address]  Uric acid     Status: Abnormal   Collection Time: 04/24/19  8:17 AM  Result Value Ref Range   Uric Acid, Serum 12.7 (H) 3.7 - 8.6 mg/dL    Comment: Performed at [facility], [address] [place], [place] [address]  Troponin I - Add-On to previous collection     Status: None   Collection Time: 04/24/19  8:17 AM  Result Value Ref Range   Troponin I <0.03 <0.03 ng/mL    Comment: Performed at [facility], [address] [place], [place] [address]  CK     Status: Abnormal   Collection Time: 04/24/19  8:17 AM  Result Value Ref  Range   Total CK 560 (H) 49 - 397 U/L    Comment: Performed at [facility], [address] [place], [place] [address]  SARS Coronavirus 2 [facility] order, Performed in [place] hospital lab)     Status: None   Collection Time: 04/24/19 10:38 AM  Result Value Ref Range   SARS Coronavirus 2 NEGATIVE NEGATIVE    Comment: (NOTE) If result is NEGATIVE SARS-CoV-2 target nucleic acids are NOT DETECTED. The SARS-CoV-2 RNA is generally detectable in upper and lower  respiratory specimens during the acute phase of infection. The lowest  concentration of SARS-CoV-2 viral copies this assay can detect is 250  copies / mL. A negative result does not preclude SARS-CoV-2 infection  and should not be used as the sole basis for treatment or other  patient management decisions.  A negative result may occur with  improper specimen collection / handling, submission of specimen other  than nasopharyngeal swab, presence of viral mutation(s) within the  areas targeted by this assay, and inadequate number of viral copies  (<250 copies / mL). A negative result must be combined with clinical  observations, patient history, and epidemiological information. If result is POSITIVE SARS-CoV-2 target nucleic acids are DETECTED. The SARS-CoV-2 RNA is generally detectable in upper and lower  respiratory specimens dur ing the acute phase of infection.  Positive  results are indicative of active infection with SARS-CoV-2.  Clinical  correlation with patient history and other diagnostic information is  necessary to determine patient infection status.  Positive results do  not rule out bacterial infection or co-infection with other viruses. If result is PRESUMPTIVE POSTIVE SARS-CoV-2 nucleic acids MAY BE PRESENT.   A presumptive positive result was obtained on the submitted specimen  and confirmed on repeat testing.  While 2019 novel coronavirus  (SARS-CoV-2) nucleic acids may be present in the submitted  sample  additional confirmatory testing may be necessary for epidemiological  and / or clinical management purposes  to differentiate between  SARS-CoV-2 and other Sarbecovirus currently known to infect humans.  If clinically indicated additional testing with an alternate test  methodology [phone]) is advised. The SARS-CoV-2 RNA is generally  detectable in upper and lower respiratory sp ecimens during the acute  phase  of infection. The expected result is Negative. Fact Sheet for Patients:  StrictlyIdeas.no Fact Sheet for Healthcare Providers: BankingDealers.co.za This test is not yet approved or cleared by the [place] FDA and has been authorized for detection and/or diagnosis of SARS-CoV-2 by FDA under an Emergency Use Authorization (EUA).  This EUA will remain in effect (meaning this test can be used) for the duration of the COVID-19 declaration under Section 564(b)(1) of the Act, 21 U.S.C. section 360bbb-3(b)(1), unless the authorization is terminated or revoked sooner. Performed at [facility], [address] [place], [place] [address]    Dg Chest Portable 1 View  Result Date: 04/24/2019 CLINICAL DATA:  Chest pain and shortness of breath. EXAM: PORTABLE CHEST 1 VIEW COMPARISON:  None. FINDINGS: Normal cardiac silhouette and mediastinal contours. Pacer pads overlie the left ventricular apex and left midclavicular line. No discrete focal airspace opacities. No pleural effusion or pneumothorax. No evidence of edema. Post right hemi humeral arthroplasty, incompletely evaluated. IMPRESSION: No acute cardiopulmonary disease on this AP portable examination. Electronically Signed   By: Sandi Mariscal M.D.   On: 04/24/2019 10:20    Pending Labs [employer] (From admission, onward)    Start     Ordered   Signed and Held  HIV antibody (Routine Testing)  Once,   R     Signed and Held   Signed and Held  CBC  (heparin)  Once,   R     Comments:  Baseline for heparin therapy IF NOT ALREADY DRAWN.  Notify MD if PLT < 100 K.    Signed and Held   Signed and Held  Creatinine, serum  (heparin)  Once,   R    Comments:  Baseline for heparin therapy IF NOT ALREADY DRAWN.    Signed and Held   Signed and Held  CBC  Tomorrow morning,   R     Signed and Held   Signed and Held  Basic metabolic panel  Tomorrow morning,   R     Signed and Held          Vitals/Pain Today's Vitals   04/24/19 1030 04/24/19 1100 04/24/19 1130 04/24/19 1230  BP: (!) 155/95 (!) 158/88 (!) 167/103 (!) 146/103  Pulse: (!) 111 77 88 89  Resp: 20 14 19 20   Temp:      TempSrc:      SpO2: 100% 98% 98% 97%  Weight:      Height:      PainSc:        Isolation Precautions No active isolations  Medications Medications  adenosine (ADENOCARD) 12 MG/4ML injection (has no administration in time range)  morphine 4 MG/ML injection 4 mg (has no administration in time range)  morphine 4 MG/ML injection 4 mg (4 mg Intravenous Given 04/24/19 1056)  LORazepam (ATIVAN) injection 0-4 mg (1 mg Intravenous Given 04/24/19 1024)    Or  LORazepam (ATIVAN) tablet 0-4 mg ( Oral See Alternative 04/24/19 1024)  LORazepam (ATIVAN) injection 0-4 mg (has no administration in time range)    Or  LORazepam (ATIVAN) tablet 0-4 mg (has no administration in time range)  thiamine (VITAMIN B-1) tablet 100 mg ( Oral See Alternative 04/24/19 1100)    Or  thiamine (B-1) injection 100 mg (100 mg Intravenous Given 04/24/19 1100)  nicotine (NICODERM CQ - dosed in mg/24 hours) patch 21 mg (21 mg Transdermal Patch Applied 04/24/19 1027)  LORazepam (ATIVAN) tablet 1 mg (has no administration in time range)  Or  LORazepam (ATIVAN) injection 1 mg (has no administration in time range)  thiamine (VITAMIN B-1) tablet 100 mg (has no administration in time range)    Or  thiamine (B-1) injection 100 mg (has no administration in time range)  folic acid (FOLVITE) tablet 1 mg (has no administration  in time range)  multivitamin with minerals tablet 1 tablet (has no administration in time range)  potassium chloride SA (K-DUR) CR tablet 40 mEq (has no administration in time range)  predniSONE (DELTASONE) tablet 50 mg (has no administration in time range)  sodium chloride 0.9 % bolus 500 mL (0 mLs Intravenous Stopped 04/24/19 1105)  adenosine (ADENOCARD) 6 MG/2ML injection 6 mg (6 mg Intravenous Given 04/24/19 1004)  ondansetron (ZOFRAN) injection 4 mg (4 mg Intravenous Given 04/24/19 1053)  magnesium sulfate IVPB 2 g 50 mL (0 g Intravenous Stopped 04/24/19 1105)  metoprolol tartrate (LOPRESSOR) injection 5 mg (5 mg Intravenous Given 04/24/19 1028)    Mobility walks Low fall risk   Focused Assessments See Assessment   R Recommendations: See Admitting Provider Note  Report given to:   Additional Notes:

## 2019-04-24 NOTE — ED Notes (Signed)
Patient is complaining of, "cramps all over".  Patient is holding his stomach and appears uncomfortable.

## 2019-04-24 NOTE — Progress Notes (Deleted)
[place] [facility] [place] NAME: Thomas Tapia    MR#:  [id number]  DATE OF BIRTH:  13-Oct-1966  DATE OF ADMISSION:  04/24/2019  PRIMARY CARE PHYSICIAN: System, Pcp Not In   REQUESTING/REFERRING PHYSICIAN: [name]  CHIEF COMPLAINT:   Chief Complaint  Patient presents with  . Gout    HISTORY OF PRESENT ILLNESS: Thomas Tapia  is a 53 y.o. male with a known history of gout, HTN who presents with cramping in his leg.  Patient was noted to have SVT on arrival.  He is also noticed to have acute renal failure.  Patient states that he has been drinking heavily.  He was tremulous in the ER.  He denies any chest pain or shortness of breath.  Complaints of gout flare.   PAST MEDICAL HISTORY:   Past Medical History:  Diagnosis Date  . Gout   . Hypertension     PAST SURGICAL HISTORY:  Past Surgical History:  Procedure Laterality Date  . TOTAL HIP ARTHROPLASTY Bilateral   . TOTAL SHOULDER REPLACEMENT Right     SOCIAL HISTORY:  Social History   Tobacco Use  . Smoking status: Current Every Day Smoker  . Smokeless tobacco: Never Used  Substance Use Topics  . Alcohol use: Not on file    FAMILY HISTORY: No family history on file.  DRUG ALLERGIES: No Known Allergies  REVIEW OF SYSTEMS:   CONSTITUTIONAL: No fever, fatigue or weakness.  EYES: No blurred or double vision.  EARS, NOSE, AND THROAT: No tinnitus or ear pain.  RESPIRATORY: No cough, shortness of breath, wheezing or hemoptysis.  CARDIOVASCULAR: No chest pain, orthopnea, edema.  GASTROINTESTINAL: No nausea, vomiting, diarrhea or abdominal pain.  GENITOURINARY: No dysuria, hematuria.  ENDOCRINE: No polyuria, nocturia,  HEMATOLOGY: No anemia, easy bruising or bleeding SKIN: No rash or lesion. MUSCULOSKELETAL: Positive gouty pain NEUROLOGIC: No tingling, numbness, weakness.  PSYCHIATRY: No anxiety or depression.   MEDICATIONS AT HOME:  Prior to Admission medications   Medication  Sig Start Date End Date Taking? Authorizing Provider  cephALEXin (KEFLEX) 500 MG capsule Take 1 capsule (500 mg total) by mouth 3 (three) times daily. Patient not taking: Reported on 04/24/2019 01/08/19   Versie Starks, PA-C  colchicine 0.6 MG tablet Take 1 tablet (0.6 mg total) by mouth daily. Patient not taking: Reported on 04/24/2019 01/08/19 01/08/20  Versie Starks, PA-C  HYDROcodone-acetaminophen (NORCO/VICODIN) 5-325 MG tablet Take 1 tablet by mouth every 6 (six) hours as needed for moderate pain. Patient not taking: Reported on 04/24/2019 01/08/19   Versie Starks, PA-C  predniSONE (STERAPRED UNI-PAK 21 TAB) 10 MG (21) TBPK tablet Take 6 pills on day one then decrease by 1 pill each day Patient not taking: Reported on 04/24/2019 01/08/19   Versie Starks, PA-C  sulfamethoxazole-trimethoprim (BACTRIM DS,SEPTRA DS) 800-160 MG tablet Take 1 tablet by mouth 2 (two) times daily. Patient not taking: Reported on 04/24/2019 01/08/19   Versie Starks, PA-C      PHYSICAL EXAMINATION:   VITAL SIGNS: Blood pressure (!) 146/103, pulse 89, temperature 98.3 F (36.8 C), temperature source Oral, resp. rate 20, height 5\' 11"  (1.803 m), weight 97.5 kg, SpO2 97 %.  GENERAL:  53 y.o.-year-old patient lying in the bed with no acute distress.  EYES: Pupils equal, round, reactive to light and accommodation. No scleral icterus. Extraocular muscles intact.  HEENT: Head atraumatic, normocephalic. Oropharynx and nasopharynx clear.  NECK:  Supple, no jugular venous distention.  No thyroid enlargement, no tenderness.  LUNGS: Normal breath sounds bilaterally, no wheezing, rales,rhonchi or crepitation. No use of accessory muscles of respiration.  CARDIOVASCULAR: S1, S2 normal. No murmurs, rubs, or gallops.  ABDOMEN: Soft, nontender, nondistended. Bowel sounds present. No organomegaly or mass.  EXTREMITIES: No pedal edema, cyanosis, or clubbing.  NEUROLOGIC: Cranial nerves II through XII are intact. Muscle strength 5/5 in  all extremities. Sensation intact. Gait not checked.  PSYCHIATRIC: The patient is alert and oriented x 3.  SKIN: No obvious rash, lesion, or ulcer.   LABORATORY PANEL:   CBC Recent Labs  Lab 04/24/19 0817  WBC 10.0  HGB 15.0  HCT 45.0  PLT 231  MCV 85.9  MCH 28.6  MCHC 33.3  RDW 13.3   ------------------------------------------------------------------------------------------------------------------  Chemistries  Recent Labs  Lab 04/24/19 0817  NA 129*  K 3.2*  CL 89*  CO2 25  GLUCOSE 115*  BUN 19  CREATININE 2.10*  CALCIUM 9.1  AST 54*  ALT 42  ALKPHOS 65  BILITOT 1.6*   ------------------------------------------------------------------------------------------------------------------ estimated creatinine clearance is 47.9 mL/min (A) (by C-G formula based on SCr of 2.1 mg/dL (H)). ------------------------------------------------------------------------------------------------------------------ No results for input(s): TSH, T4TOTAL, T3FREE, THYROIDAB in the last 72 hours.  Invalid input(s): FREET3   Coagulation profile No results for input(s): INR, PROTIME in the last 168 hours. ------------------------------------------------------------------------------------------------------------------- No results for input(s): DDIMER in the last 72 hours. -------------------------------------------------------------------------------------------------------------------  Cardiac Enzymes Recent Labs  Lab 04/24/19 0817  TROPONINI <0.03   ------------------------------------------------------------------------------------------------------------------ Invalid input(s): POCBNP  ---------------------------------------------------------------------------------------------------------------  Urinalysis No results found for: COLORURINE, APPEARANCEUR, LABSPEC, PHURINE, GLUCOSEU, HGBUR, BILIRUBINUR, KETONESUR, PROTEINUR, UROBILINOGEN, NITRITE, LEUKOCYTESUR   RADIOLOGY: Dg  Chest Portable 1 View  Result Date: 04/24/2019 CLINICAL DATA:  Chest pain and shortness of breath. EXAM: PORTABLE CHEST 1 VIEW COMPARISON:  None. FINDINGS: Normal cardiac silhouette and mediastinal contours. Pacer pads overlie the left ventricular apex and left midclavicular line. No discrete focal airspace opacities. No pleural effusion or pneumothorax. No evidence of edema. Post right hemi humeral arthroplasty, incompletely evaluated. IMPRESSION: No acute cardiopulmonary disease on this AP portable examination. Electronically Signed   By: Sandi Mariscal M.D.   On: 04/24/2019 10:20    EKG: Orders placed or performed during the hospital encounter of 04/24/19  . [place] EKG  . [place]ae EKG  . EKG 12-Lead  . EKG 12-Lead  . EKG 12-Lead  . EKG 12-Lead    IMPRESSION AND PLAN: Patient is a 53 year old with alcohol abuse presents with SVT acute renal failure  1.  Acute renal failure due to dehydration we will give IV fluids follow renal function  2.  Alcohol withdrawal we will place on CIWA protocol  3.  Accelerated hypertension I will start him on metoprolol Use IV hydralazine PRN  4.  SVT start metoprolol bid  5. Misc: scd's  6.  Nicotine abuse smoking cessation provided 4 minutes spent , smoking cessation provided   All the records are reviewed and case discussed with [facility] provider. Management plans discussed with the patient, family and they are in agreement.  CODE STATUS:    Code Status Orders  (From admission, onward)         Start     Ordered   04/24/19 1009  Full code  Continuous     04/24/19 1008        Code Status History    This patient has a current code status but no historical code status.       TOTAL TIME TAKING  CARE OF THIS PATIENT: 55 minutes.    Dustin Flock M.D on 04/24/2019 at 1:04 PM  Between 7am to 6pm - Pager - [phone]  After 6pm go to [email or web address] - password [email or web address] [facility]  [facility] [employer] Office  [phone]  CC: Primary care physician; System,  Pcp Not In

## 2019-04-24 NOTE — ED Triage Notes (Signed)
Patient reports pain in hands and feet that feel similar to past gout pain.  Patient states, "my gout is flaring up."  Patient denies any other symptoms.

## 2019-04-24 NOTE — Plan of Care (Signed)
  Problem: Education: Goal: Knowledge of General Education information will improve Description Including pain rating scale, medication(s)/side effects and non-pharmacologic comfort measures Outcome: Progressing   

## 2019-04-24 NOTE — ED Notes (Signed)
Pt presented to room 10 via stretcher by Vicente Males, Therapist, sports. Dr. Quentin Cornwall to bedside after reviewing repeat EKG. Pt found to be in SVT with a rate of 160. Zoll defibrillator brought to bedside, 2nd IV initiated by this RN. 6mg  Adenosine administered with EDP at bedside. Successful conversion from SVT to ST. Pt tolerated well. Will continue to monitor.

## 2019-04-25 LAB — CBC
HCT: 41.4 % (ref 39.0–52.0)
Hemoglobin: 13.4 g/dL (ref 13.0–17.0)
MCH: 28.8 pg (ref 26.0–34.0)
MCHC: 32.4 g/dL (ref 30.0–36.0)
MCV: 88.8 fL (ref 80.0–100.0)
Platelets: 198 10*3/uL (ref 150–400)
RBC: 4.66 MIL/uL (ref 4.22–5.81)
RDW: 13.4 % (ref 11.5–15.5)
WBC: 6.1 10*3/uL (ref 4.0–10.5)
nRBC: 0 % (ref 0.0–0.2)

## 2019-04-25 LAB — BASIC METABOLIC PANEL
Anion gap: 11 (ref 5–15)
BUN: 24 mg/dL — ABNORMAL HIGH (ref 6–20)
CO2: 23 mmol/L (ref 22–32)
Calcium: 8.6 mg/dL — ABNORMAL LOW (ref 8.9–10.3)
Chloride: 99 mmol/L (ref 98–111)
Creatinine, Ser: 1.96 mg/dL — ABNORMAL HIGH (ref 0.61–1.24)
GFR calc Af Amer: 44 mL/min — ABNORMAL LOW (ref >60)
GFR calc non Af Amer: 38 mL/min — ABNORMAL LOW (ref >60)
Glucose, Bld: 115 mg/dL — ABNORMAL HIGH (ref 70–99)
Potassium: 3.8 mmol/L (ref 3.5–5.1)
Sodium: 133 mmol/L — ABNORMAL LOW (ref 135–145)

## 2019-04-25 MED ORDER — METOPROLOL TARTRATE 50 MG PO TABS
50.0000 mg | ORAL_TABLET | Freq: Two times a day (BID) | ORAL | Status: DC
  Administered 2019-04-25 – 2019-04-26 (×3): 50 mg via ORAL
  Filled 2019-04-25 (×3): qty 1

## 2019-04-25 MED ORDER — HYDRALAZINE HCL 25 MG PO TABS
25.0000 mg | ORAL_TABLET | Freq: Three times a day (TID) | ORAL | Status: DC
  Administered 2019-04-25 – 2019-04-26 (×4): 25 mg via ORAL
  Filled 2019-04-25 (×4): qty 1

## 2019-04-25 NOTE — Progress Notes (Addendum)
[place] at [facility]                                                                                                                                                                                  Patient Demographics   Christianjames Soule, is a 53 y.o. male, DOB - 1966/11/25, OAC:[id number]  Admit date - 04/24/2019   Admitting Physician Dustin Flock, MD  Outpatient Primary MD for the patient is System, Pcp Not In   LOS - 1  Subjective: Patient states that he is feeling better Still has some pain in his hands cramping improved   Review of Systems:   CONSTITUTIONAL: No documented fever. No fatigue, weakness. No weight gain, no weight loss.  EYES: No blurry or double vision.  ENT: No tinnitus. No postnasal drip. No redness of the oropharynx.  RESPIRATORY: No cough, no wheeze, no hemoptysis. No dyspnea.  CARDIOVASCULAR: No chest pain. No orthopnea. No palpitations. No syncope.  GASTROINTESTINAL: No nausea, no vomiting or diarrhea. No abdominal pain. No melena or hematochezia.  GENITOURINARY: No dysuria or hematuria.  ENDOCRINE: No polyuria or nocturia. No heat or cold intolerance.  HEMATOLOGY: No anemia. No bruising. No bleeding.  INTEGUMENTARY: No rashes. No lesions.  MUSCULOSKELETAL: No arthritis. No swelling.  Positive gout.  NEUROLOGIC: No numbness, tingling, or ataxia. No seizure-type activity.  PSYCHIATRIC: No anxiety. No insomnia. No ADD.    Vitals:   Vitals:   04/25/19 0412 04/25/19 0414 04/25/19 0746 04/25/19 0748  BP:  (!) 149/96 (!) 171/104 (!) 164/96  Pulse:  87 79 79  Resp:  20 20   Temp:  97.7 F (36.5 C) (!) 97.3 F (36.3 C)   TempSrc:  Oral Oral   SpO2:  98% 100% 98%  Weight: 98.6 kg     Height:        Wt Readings from Last 3 Encounters:  04/25/19 98.6 kg  01/08/19 102.1 kg  10/03/18 102.1 kg     Intake/Output Summary (Last 24 hours) at 04/25/2019 1134 Last data filed at 04/25/2019 0730 Gross per 24 hour  Intake 1278.29 ml   Output 495 ml  Net 783.29 ml    Physical Exam:   GENERAL: Pleasant-appearing in no apparent distress.  HEAD, EYES, EARS, NOSE AND THROAT: Atraumatic, normocephalic. Extraocular muscles are intact. Pupils equal and reactive to light. Sclerae anicteric. No conjunctival injection. No oro-pharyngeal erythema.  NECK: Supple. There is no jugular venous distention. No bruits, no lymphadenopathy, no thyromegaly.  HEART: Regular rate and rhythm,. No murmurs, no rubs, no clicks.  LUNGS: Clear to auscultation bilaterally. No rales or rhonchi. No wheezes.  ABDOMEN: Soft, flat, nontender, nondistended. Has good bowel  sounds. No hepatosplenomegaly appreciated.  EXTREMITIES: No evidence of any cyanosis, clubbing, or peripheral edema.  +2 pedal and radial pulses bilaterally.  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  SKIN: Moist and warm with no rashes appreciated.  Psych: Not anxious, depressed LN: No inguinal LN enlargement    Antibiotics   Anti-infectives (From admission, onward)   None      Medications   Scheduled Meds: . folic acid  1 mg Oral Daily  . heparin  5,000 Units Subcutaneous Q8H  . hydrALAZINE  25 mg Oral Q8H  . metoprolol tartrate  50 mg Oral BID  . multivitamin with minerals  1 tablet Oral Daily  . predniSONE  50 mg Oral Q breakfast  . thiamine  100 mg Oral Daily   Or  . thiamine  100 mg Intravenous Daily   Continuous Infusions: . sodium chloride 125 mL/hr at 04/25/19 0550   PRN Meds:.acetaminophen **OR** acetaminophen, HYDROcodone-acetaminophen, LORazepam **OR** LORazepam, ondansetron **OR** ondansetron (ZOFRAN) IV   Data Review:   Micro Results Recent Results (from the past 240 hour(s))  SARS Coronavirus 2 [facility] order, Performed in [place] hospital lab)     Status: None   Collection Time: 04/24/19 10:38 AM  Result Value  Ref Range Status   SARS Coronavirus 2 NEGATIVE NEGATIVE Final    Comment: (NOTE) If result is NEGATIVE SARS-CoV-2 target nucleic acids are NOT DETECTED. The SARS-CoV-2 RNA is generally detectable in upper and lower  respiratory specimens during the acute phase of infection. The lowest  concentration of SARS-CoV-2 viral copies this assay can detect is 250  copies / mL. A negative result does not preclude SARS-CoV-2 infection  and should not be used as the sole basis for treatment or other  patient management decisions.  A negative result may occur with  improper specimen collection / handling, submission of specimen other  than nasopharyngeal swab, presence of viral mutation(s) within the  areas targeted by this assay, and inadequate number of viral copies  (<250 copies / mL). A negative result must be combined with clinical  observations, patient history, and epidemiological information. If result is POSITIVE SARS-CoV-2 target nucleic acids are DETECTED. The SARS-CoV-2 RNA is generally detectable in upper and lower  respiratory specimens dur ing the acute phase of infection.  Positive  results are indicative of active infection with SARS-CoV-2.  Clinical  correlation with patient history and other diagnostic information is  necessary to determine patient infection status.  Positive results do  not rule out bacterial infection or co-infection with other viruses. If result is PRESUMPTIVE POSTIVE SARS-CoV-2 nucleic acids MAY BE PRESENT.   A presumptive positive result was obtained on the submitted specimen  and confirmed on repeat testing.  While 2019 novel coronavirus  (SARS-CoV-2) nucleic acids may be present in the submitted sample  additional confirmatory testing may be necessary for epidemiological  and / or clinical management purposes  to differentiate between  SARS-CoV-2 and other Sarbecovirus currently known to infect humans.  If clinically indicated additional testing with an  alternate test  methodology [phone]) is advised. The SARS-CoV-2 RNA is generally  detectable in upper and lower respiratory sp ecimens during the acute  phase of infection. The expected result is Negative. Fact Sheet for Patients:  StrictlyIdeas.no Fact Sheet for Healthcare Providers: BankingDealers.co.za This test is not yet approved or cleared by the [place]  FDA and has been authorized for detection and/or diagnosis of SARS-CoV-2 by FDA under an Emergency Use Authorization (EUA).  This EUA will remain in effect (meaning this test can be used) for the duration of the COVID-19 declaration under Section 564(b)(1) of the Act, 21 U.S.C. section 360bbb-3(b)(1), unless the authorization is terminated or revoked sooner. Performed at [facility], [address] [place], [place] [address]     Radiology Reports Dg Chest Portable 1 View  Result Date: 04/24/2019 CLINICAL DATA:  Chest pain and shortness of breath. EXAM: PORTABLE CHEST 1 VIEW COMPARISON:  None. FINDINGS: Normal cardiac silhouette and mediastinal contours. Pacer pads overlie the left ventricular apex and left midclavicular line. No discrete focal airspace opacities. No pleural effusion or pneumothorax. No evidence of edema. Post right hemi humeral arthroplasty, incompletely evaluated. IMPRESSION: No acute cardiopulmonary disease on this AP portable examination. Electronically Signed   By: Sandi Mariscal M.D.   On: 04/24/2019 10:20     CBC Recent Labs  Lab 04/24/19 0817 04/25/19 0455  WBC 10.0 6.1  HGB 15.0 13.4  HCT 45.0 41.4  PLT 231 198  MCV 85.9 88.8  MCH 28.6 28.8  MCHC 33.3 32.4  RDW 13.3 13.4    Chemistries  Recent Labs  Lab 04/24/19 0817 04/25/19 0455  NA 129* 133*  K 3.2* 3.8  CL 89* 99  CO2 25 23  GLUCOSE 115* 115*  BUN 19 24*  CREATININE 2.10* 1.96*  CALCIUM 9.1 8.6*  AST 54*  --   ALT 42  --   ALKPHOS 65  --   BILITOT 1.6*  --     ------------------------------------------------------------------------------------------------------------------ estimated creatinine clearance is 51.6 mL/min (A) (by C-G formula based on SCr of 1.96 mg/dL (H)). ------------------------------------------------------------------------------------------------------------------ No results for input(s): HGBA1C in the last 72 hours. ------------------------------------------------------------------------------------------------------------------ No results for input(s): CHOL, HDL, LDLCALC, TRIG, CHOLHDL, LDLDIRECT in the last 72 hours. ------------------------------------------------------------------------------------------------------------------ No results for input(s): TSH, T4TOTAL, T3FREE, THYROIDAB in the last 72 hours.  Invalid input(s): FREET3 ------------------------------------------------------------------------------------------------------------------ No results for input(s): VITAMINB12, FOLATE, FERRITIN, TIBC, IRON, RETICCTPCT in the last 72 hours.  Coagulation profile No results for input(s): INR, PROTIME in the last 168 hours.  No results for input(s): DDIMER in the last 72 hours.  Cardiac Enzymes Recent Labs  Lab 04/24/19 0817  TROPONINI <0.03   ------------------------------------------------------------------------------------------------------------------ Invalid input(s): POCBNP    Assessment & Plan   1.  Acute renal failure due to dehydration continue IV fluids renal function improved  2.  Alcohol withdrawal continue CIWA protocol  3.  Accelerated hypertension continue metoprolol Use IV hydralazine PRN  4.  SVT continue metoprolol bid  5.  Rhabdomyolysis follow CPK in the morning  6.  Gout flare patient will be continued on prednisone he will eventually need allopurinol once the current gout flare is finished.  7.  Nicotine abuse smoking cessation provided 4 minutes spent , smoking cessation  provided     Code Status Orders  (From admission, onward)         Start     Ordered   04/24/19 1444  Full code  Continuous     04/24/19 1443        Code Status History    Date Active Date Inactive Code Status Order ID Comments User Context   04/24/2019 1008 04/24/2019 1443 Full Code [id number]  Merlyn Lot, MD [place]           Consults none  DVT Prophylaxis  Lovenox  Lab Results  Component Value  Date   PLT 198 04/25/2019     Time Spent in minutes 72min Greater than 50% of time spent in care coordination and counseling patient regarding the condition and plan of care.   Dustin Flock M.D on 04/25/2019 at 11:34 AM  Between 7am to 6pm - Pager - [phone]  After 6pm go to [email or web address] - Proofreader  [employer]   Office  [phone]

## 2019-04-25 NOTE — H&P (Signed)
[place] at [place] NAME: Thomas Tapia    MR#:  [id number]  DATE OF BIRTH:  February 18, 1966  DATE OF ADMISSION:  04/24/2019  PRIMARY CARE PHYSICIAN: System, Pcp Not In   REQUESTING/REFERRING PHYSICIAN: [name]  CHIEF COMPLAINT:   Chief Complaint  Patient presents with  . Gout    HISTORY OF PRESENT ILLNESS: Thomas Tapia  is a 53 y.o. male with a known history of gout, HTN who presents with cramping in his leg.  Patient was noted to have SVT on arrival.  He is also noticed to have acute renal failure.  Patient states that he has been drinking heavily.  He was tremulous in the ER.  He denies any chest pain or shortness of breath.  Complaints of gout flare.   PAST MEDICAL HISTORY:   Past Medical History:  Diagnosis Date  . Gout   . Hypertension     PAST SURGICAL HISTORY:  Past Surgical History:  Procedure Laterality Date  . TOTAL HIP ARTHROPLASTY Bilateral   . TOTAL SHOULDER REPLACEMENT Right     SOCIAL HISTORY:  Social History   Tobacco Use  . Smoking status: Current Every Day Smoker  . Smokeless tobacco: Never Used  Substance Use Topics  . Alcohol use: Yes    FAMILY HISTORY:  Family History  Problem Relation Age of Onset  . Hypertension Mother     DRUG ALLERGIES: No Known Allergies  REVIEW OF SYSTEMS:   CONSTITUTIONAL: No fever, fatigue or weakness.  EYES: No blurred or double vision.  EARS, NOSE, AND THROAT: No tinnitus or ear pain.  RESPIRATORY: No cough, shortness of breath, wheezing or hemoptysis.  CARDIOVASCULAR: No chest pain, orthopnea, edema.  GASTROINTESTINAL: No nausea, vomiting, diarrhea or abdominal pain.  GENITOURINARY: No dysuria, hematuria.  ENDOCRINE: No polyuria, nocturia,  HEMATOLOGY: No anemia, easy bruising or bleeding SKIN: No rash or lesion. MUSCULOSKELETAL: Positive gouty pain NEUROLOGIC: No tingling, numbness, weakness.  PSYCHIATRY: No anxiety or depression.   MEDICATIONS AT HOME:  Prior  to Admission medications   Medication Sig Start Date End Date Taking? Authorizing Provider  cephALEXin (KEFLEX) 500 MG capsule Take 1 capsule (500 mg total) by mouth 3 (three) times daily. Patient not taking: Reported on 04/24/2019 01/08/19   Versie Starks, PA-C  colchicine 0.6 MG tablet Take 1 tablet (0.6 mg total) by mouth daily. Patient not taking: Reported on 04/24/2019 01/08/19 01/08/20  Versie Starks, PA-C  HYDROcodone-acetaminophen (NORCO/VICODIN) 5-325 MG tablet Take 1 tablet by mouth every 6 (six) hours as needed for moderate pain. Patient not taking: Reported on 04/24/2019 01/08/19   Versie Starks, PA-C  predniSONE (STERAPRED UNI-PAK 21 TAB) 10 MG (21) TBPK tablet Take 6 pills on day one then decrease by 1 pill each day Patient not taking: Reported on 04/24/2019 01/08/19   Versie Starks, PA-C  sulfamethoxazole-trimethoprim (BACTRIM DS,SEPTRA DS) 800-160 MG tablet Take 1 tablet by mouth 2 (two) times daily. Patient not taking: Reported on 04/24/2019 01/08/19   Versie Starks, PA-C      PHYSICAL EXAMINATION:   VITAL SIGNS: Blood pressure (!) 164/96, pulse 79, temperature (!) 97.3 F (36.3 C), temperature source Oral, resp. rate 20, height 5\' 11"  (1.803 m), weight 98.6 kg, SpO2 98 %.  GENERAL:  53 y.o.-year-old patient lying in the bed with no acute distress.  EYES: Pupils equal, round, reactive to light and accommodation. No scleral icterus. Extraocular muscles intact.  HEENT: Head atraumatic, normocephalic. Oropharynx and  nasopharynx clear.  NECK:  Supple, no jugular venous distention. No thyroid enlargement, no tenderness.  LUNGS: Normal breath sounds bilaterally, no wheezing, rales,rhonchi or crepitation. No use of accessory muscles of respiration.  CARDIOVASCULAR: S1, S2 normal. No murmurs, rubs, or gallops.  ABDOMEN: Soft, nontender, nondistended. Bowel sounds present. No organomegaly or mass.  EXTREMITIES: No pedal edema, cyanosis, or clubbing.  NEUROLOGIC: Cranial nerves II  through XII are intact. Muscle strength 5/5 in all extremities. Sensation intact. Gait not checked.  PSYCHIATRIC: The patient is alert and oriented x 3.  SKIN: No obvious rash, lesion, or ulcer.   LABORATORY PANEL:   CBC Recent Labs  Lab 04/24/19 0817 04/25/19 0455  WBC 10.0 6.1  HGB 15.0 13.4  HCT 45.0 41.4  PLT 231 198  MCV 85.9 88.8  MCH 28.6 28.8  MCHC 33.3 32.4  RDW 13.3 13.4   ------------------------------------------------------------------------------------------------------------------  Chemistries  Recent Labs  Lab 04/24/19 0817 04/25/19 0455  NA 129* 133*  K 3.2* 3.8  CL 89* 99  CO2 25 23  GLUCOSE 115* 115*  BUN 19 24*  CREATININE 2.10* 1.96*  CALCIUM 9.1 8.6*  AST 54*  --   ALT 42  --   ALKPHOS 65  --   BILITOT 1.6*  --    ------------------------------------------------------------------------------------------------------------------ estimated creatinine clearance is 51.6 mL/min (A) (by C-G formula based on SCr of 1.96 mg/dL (H)). ------------------------------------------------------------------------------------------------------------------ No results for input(s): TSH, T4TOTAL, T3FREE, THYROIDAB in the last 72 hours.  Invalid input(s): FREET3   Coagulation profile No results for input(s): INR, PROTIME in the last 168 hours. ------------------------------------------------------------------------------------------------------------------- No results for input(s): DDIMER in the last 72 hours. -------------------------------------------------------------------------------------------------------------------  Cardiac Enzymes Recent Labs  Lab 04/24/19 0817  TROPONINI <0.03   ------------------------------------------------------------------------------------------------------------------ Invalid input(s): POCBNP  ---------------------------------------------------------------------------------------------------------------  Urinalysis No  results found for: COLORURINE, APPEARANCEUR, LABSPEC, PHURINE, GLUCOSEU, HGBUR, BILIRUBINUR, KETONESUR, PROTEINUR, UROBILINOGEN, NITRITE, LEUKOCYTESUR   RADIOLOGY: Dg Chest Portable 1 View  Result Date: 04/24/2019 CLINICAL DATA:  Chest pain and shortness of breath. EXAM: PORTABLE CHEST 1 VIEW COMPARISON:  None. FINDINGS: Normal cardiac silhouette and mediastinal contours. Pacer pads overlie the left ventricular apex and left midclavicular line. No discrete focal airspace opacities. No pleural effusion or pneumothorax. No evidence of edema. Post right hemi humeral arthroplasty, incompletely evaluated. IMPRESSION: No acute cardiopulmonary disease on this AP portable examination. Electronically Signed   By: Sandi Mariscal M.D.   On: 04/24/2019 10:20    EKG: Orders placed or performed during the hospital encounter of 04/24/19  . [place] EKG  . [place] EKG  . EKG 12-Lead  . EKG 12-Lead  . EKG 12-Lead  . EKG 12-Lead    IMPRESSION AND PLAN: Patient is a 53 year old with alcohol abuse presents with SVT acute renal failure  1.  Acute renal failure due to dehydration we will give IV fluids follow renal function  2.  Alcohol withdrawal we will place on CIWA protocol  3.  Accelerated hypertension I will start him on metoprolol Use IV hydralazine PRN  4.  SVT start metoprolol bid  5. Misc: scd's  6.  Nicotine abuse smoking cessation provided 4 minutes spent , smoking cessation provided   All the records are reviewed and case discussed with [facility] provider. Management plans discussed with the patient, family and they are in agreement.  CODE STATUS:    Code Status Orders  (From admission, onward)         Start     Ordered   04/24/19 1009  Full code  Continuous     04/24/19 1008        Code Status History    This patient has a current code status but no historical code status.       TOTAL TIME TAKING CARE OF THIS PATIENT: 55 minutes.    Dustin Flock M.D on 04/25/2019 at 11:33  AM  Between 7am to 6pm - Pager - [phone]  After 6pm go to [email or web address] - password [email or web address] [facility]  [employer] Office  [phone]  CC: Primary care physician; System, Pcp Not In

## 2019-04-25 NOTE — TOC Initial Note (Signed)
Transition of Care [facility]) - Initial/Assessment Note    Patient Details  Name: Thomas Tapia MRN: [id number] Date of Birth: 1966-04-01  [facility] CM/SW Contact:    Elza Rafter, RN Phone Number: 04/25/2019, 3:54 PM  Clinical Narrative:   Patient is from home with girlfriend.  Fairly new to the area; CM consult for PCP.  He is A&Ox4; independent from home.  [facility]     [facility] provided list of local PCP's. He states he will research and make an appointment.  No issues with transportation.  Plan is to DC tomorrow.  No further needs.               Expected Discharge Plan: Home/Self Care Barriers to Discharge: Continued Medical Work up   Patient Goals and CMS Choice        Expected Discharge Plan and Services Expected Discharge Plan: Home/Self Care       Living arrangements for the past 2 months: Single Family Home                                      Prior Living Arrangements/Services Living arrangements for the past 2 months: Single Family Home Lives with:: Domestic Partner   Do you feel safe going back to the place where you live?: Yes      Need for Family Participation in Patient Care: No (Comment) Care giver support system in place?: Yes (comment)(Girl friend)   Criminal Activity/Legal Involvement Pertinent to Current Situation/Hospitalization: No - Comment as needed  Activities of Daily Living Home Assistive Devices/Equipment: None ADL Screening (condition at time of admission) Patient's cognitive ability adequate to safely complete daily activities?: Yes Is the patient deaf or have difficulty hearing?: No Does the patient have difficulty seeing, even when wearing glasses/contacts?: No Does the patient have difficulty concentrating, remembering, or making decisions?: No Patient able to express need for assistance with ADLs?: Yes Does the patient have difficulty dressing or bathing?: No Independently performs ADLs?: Yes  (appropriate for developmental age) Does the patient have difficulty walking or climbing stairs?: No Weakness of Legs: None Weakness of Arms/Hands: None  Permission Sought/Granted                  Emotional Assessment Appearance:: Appears stated age Attitude/Demeanor/Rapport: Gracious Affect (typically observed): Accepting Orientation: : Oriented to Self, Oriented to Place, Oriented to  Time, Oriented to Situation Alcohol / Substance Use: Not Applicable    Admission diagnosis:  SVT (supraventricular tachycardia) (HCC) [I47.1] Alcohol withdrawal syndrome with complication [facility]) [B71.696] Patient Active Problem List   Diagnosis Date Noted  . Acute renal failure (ARF) ([place]) 04/24/2019   PCP:  System, Pcp Not In Pharmacy:   [id number] - [place], [place] [address] Phone: [phone] Fax: [phone]     Social Determinants of Health (SDOH) Interventions    Readmission Risk Interventions No flowsheet data found.

## 2019-04-25 NOTE — Plan of Care (Signed)
  Problem: Clinical Measurements: Goal: Respiratory complications will improve Outcome: Progressing   Problem: Education: Goal: Knowledge of General Education information will improve Description Including pain rating scale, medication(s)/side effects and non-pharmacologic comfort measures Outcome: Completed/Met

## 2019-04-26 LAB — BASIC METABOLIC PANEL
Anion gap: 10 (ref 5–15)
BUN: 21 mg/dL — ABNORMAL HIGH (ref 6–20)
CO2: 22 mmol/L (ref 22–32)
Calcium: 8.4 mg/dL — ABNORMAL LOW (ref 8.9–10.3)
Chloride: 101 mmol/L (ref 98–111)
Creatinine, Ser: 1.36 mg/dL — ABNORMAL HIGH (ref 0.61–1.24)
GFR calc Af Amer: >60 mL/min (ref >60)
GFR calc non Af Amer: 59 mL/min — ABNORMAL LOW (ref >60)
Glucose, Bld: 122 mg/dL — ABNORMAL HIGH (ref 70–99)
Potassium: 4.1 mmol/L (ref 3.5–5.1)
Sodium: 133 mmol/L — ABNORMAL LOW (ref 135–145)

## 2019-04-26 LAB — CK: Total CK: 450 U/L — ABNORMAL HIGH (ref 49–397)

## 2019-04-26 LAB — HIV ANTIBODY (ROUTINE TESTING W REFLEX): HIV Screen 4th Generation wRfx: NONREACTIVE

## 2019-04-26 MED ORDER — AMLODIPINE BESYLATE 5 MG PO TABS: 5.0000 mg | ORAL_TABLET | Freq: Every day | ORAL | 0 refills | Status: DC

## 2019-04-26 MED ORDER — FOLIC ACID 1 MG PO TABS: 1.0000 mg | ORAL_TABLET | Freq: Every day | ORAL | 0 refills | Status: AC

## 2019-04-26 MED ORDER — AMLODIPINE BESYLATE 5 MG PO TABS
5.0000 mg | ORAL_TABLET | Freq: Every day | ORAL | Status: DC
  Administered 2019-04-26: 5 mg via ORAL
  Filled 2019-04-26: qty 1

## 2019-04-26 MED ORDER — PREDNISONE 50 MG PO TABS: 50.0000 mg | ORAL_TABLET | Freq: Every day | ORAL | 0 refills | Status: DC

## 2019-04-26 MED ORDER — AMLODIPINE BESYLATE 5 MG PO TABS: 5.0000 mg | ORAL_TABLET | Freq: Every day | ORAL | 0 refills | Status: AC

## 2019-04-26 MED ORDER — METOPROLOL TARTRATE 50 MG PO TABS: 50.0000 mg | ORAL_TABLET | Freq: Two times a day (BID) | ORAL | 0 refills | Status: AC

## 2019-04-26 MED ORDER — THIAMINE HCL 100 MG PO TABS: 100.0000 mg | ORAL_TABLET | Freq: Every day | ORAL | 0 refills | Status: AC

## 2019-04-26 MED ORDER — ADULT MULTIVITAMIN W/MINERALS CH: 1.0000 | ORAL_TABLET | Freq: Every day | ORAL | 0 refills | Status: AC

## 2019-04-26 MED ORDER — AMLODIPINE BESYLATE 5 MG PO TABS: 5.0000 mg | ORAL_TABLET | Freq: Every day | ORAL | Status: DC

## 2019-04-26 NOTE — Discharge Summary (Signed)
[facility] NAME: Thomas Tapia    MR#:  [id number]  DATE OF BIRTH:  1966-01-02  DATE OF ADMISSION:  04/24/2019   ADMITTING PHYSICIAN: Dustin Flock, MD  DATE OF DISCHARGE: 04/26/19   PRIMARY CARE PHYSICIAN: System, Pcp Not In   ADMISSION DIAGNOSIS:   SVT (supraventricular tachycardia) (HCC) [I47.1] Alcohol withdrawal syndrome with complication ([place]) [B58.309]  DISCHARGE DIAGNOSIS:   Active Problems:   Acute renal failure (ARF) ([place])   SECONDARY DIAGNOSIS:   Past Medical History:  Diagnosis Date  . Gout   . Hypertension     HOSPITAL COURSE:  53 year old male with history of gout, hypertension, tobacco abuse, and alcohol abuse presenting with ramping pain in bilateral hands and feet for several days. Patient admits to binge drinking and poor p.o. intake.  Found to be in AKI and hypertensive crisis.  1.  Acute kidney injury -likely secondary to dehydration from poor p.o. intake and to be effort ammonia -Likely prerenal courses -BUN and creatinine improved with IV fluids  2.  Gout flare -uric acid elevated on admission -Treated with prednisone - We will continue prednisone for additional 4 days - Patient to follow-up with PCP for consideration of allopurinol.  Patient advised that he will need to avoid alcohol with albuterol due to potential increase in blood levels of uric acid.  3.  Hypertensive urgency -patient has history of hypertension on losartan and amlodipine home. -Presented with elevated blood pressure -Patient was started on IV hydralazine -Metoprolol added for rate control.  Will continue at discharge -Stop losartan at discharge due to AKI as above -Continue amlodipine at home dose -Patient will follow-up with PCP in 1 week  4.  SVT -started on metoprolol for rate control -Continue metoprolol at discharge  5.  Alcohol and tobacco abuse - Alcohol and tobacco cessation counseling provided    DISCHARGE CONDITIONS:   Stable CONSULTS OBTAINED:     DRUG ALLERGIES:   No Known Allergies DISCHARGE MEDICATIONS:   Allergies as of 04/26/2019   No Known Allergies     Medication List    STOP taking these medications   cephALEXin 500 MG capsule Commonly known as:  KEFLEX   colchicine 0.6 MG tablet   HYDROcodone-acetaminophen 5-325 MG tablet Commonly known as:  NORCO/VICODIN   predniSONE 10 MG (21) Tbpk tablet Commonly known as:  STERAPRED UNI-PAK 21 TAB   sulfamethoxazole-trimethoprim 800-160 MG tablet Commonly known as:  BACTRIM DS     TAKE these medications   amLODipine 5 MG tablet Commonly known as:  NORVASC Take 1 tablet (5 mg total) by mouth daily.   folic acid 1 MG tablet Commonly known as:  FOLVITE Take 1 tablet (1 mg total) by mouth daily.   metoprolol tartrate 50 MG tablet Commonly known as:  LOPRESSOR Take 1 tablet (50 mg total) by mouth 2 (two) times daily.   multivitamin with minerals Tabs tablet Take 1 tablet by mouth daily.   thiamine 100 MG tablet Take 1 tablet (100 mg total) by mouth daily.        DISCHARGE INSTRUCTIONS:    DIET:   Low fat, Low cholesterol diet  ACTIVITY:   Activity as tolerated  OXYGEN:   Home Oxygen: No.  Oxygen Delivery: room air  DISCHARGE LOCATION:   home   If you experience worsening of your admission symptoms, develop shortness of breath, life threatening emergency, suicidal or homicidal thoughts you must seek medical attention immediately by calling  911 or calling your MD immediately  if symptoms less severe.  You Must read complete instructions/literature along with all the possible adverse reactions/side effects for all the Medicines you take and that have been prescribed to you. Take any new Medicines after you have completely understood and accpet all the possible adverse reactions/side effects.   Please note  You were cared for by a hospitalist during your hospital stay. If you have any  questions about your discharge medications or the care you received while you were in the hospital after you are discharged, you can call the unit and asked to speak with the hospitalist on call if the hospitalist that took care of you is not available. Once you are discharged, your primary care physician will handle any further medical issues. Please note that NO REFILLS for any discharge medications will be authorized once you are discharged, as it is imperative that you return to your primary care physician (or establish a relationship with a primary care physician if you do not have one) for your aftercare needs so that they can reassess your need for medications and monitor your lab values.    On the day of Discharge:  VITAL SIGNS:   Blood pressure (!) 192/86, pulse 63, temperature 97.6 F (36.4 C), temperature source Oral, resp. rate 19, height 5\' 11"  (1.803 m), weight 102.2 kg, SpO2 98 %.  PHYSICAL EXAMINATION:    GENERAL:  53 y.o.-year-old patient lying in the bed with no acute distress.  EYES: Pupils equal, round, reactive to light and accommodation. No scleral icterus. Extraocular muscles intact.  HEENT: Head atraumatic, normocephalic. Oropharynx and nasopharynx clear.  NECK:  Supple, no jugular venous distention. No thyroid enlargement, no tenderness.  LUNGS: Normal breath sounds bilaterally, no wheezing, rales,rhonchi or crepitation. No use of accessory muscles of respiration.  CARDIOVASCULAR: S1, S2 normal. No murmurs, rubs, or gallops.  ABDOMEN: Soft, non-tender, non-distended. Bowel sounds present. No organomegaly or mass.  EXTREMITIES: No pedal edema, cyanosis, or clubbing.  NEUROLOGIC: Cranial nerves II through XII are intact. Muscle strength 5/5 in all extremities. Sensation intact. Gait not checked.  PSYCHIATRIC: The patient is alert and oriented x 3.  SKIN: No obvious rash, lesion, or ulcer.   DATA REVIEW:   CBC Recent Labs  Lab 04/25/19 0455  WBC 6.1  HGB 13.4   HCT 41.4  PLT 198    Chemistries  Recent Labs  Lab 04/24/19 0817  04/26/19 0425  NA 129*   < > 133*  K 3.2*   < > 4.1  CL 89*   < > 101  CO2 25   < > 22  GLUCOSE 115*   < > 122*  BUN 19   < > 21*  CREATININE 2.10*   < > 1.36*  CALCIUM 9.1   < > 8.4*  AST 54*  --   --   ALT 42  --   --   ALKPHOS 65  --   --   BILITOT 1.6*  --   --    < > = values in this interval not displayed.     Microbiology Results  Results for orders placed or performed during the hospital encounter of 04/24/19  SARS Coronavirus 2 [facility] order, Performed in [facility] hospital lab)     Status: None   Collection Time: 04/24/19 10:38 AM  Result Value Ref Range Status   SARS Coronavirus 2 NEGATIVE NEGATIVE Final    Comment: (NOTE) If result is NEGATIVE SARS-CoV-2 target  nucleic acids are NOT DETECTED. The SARS-CoV-2 RNA is generally detectable in upper and lower  respiratory specimens during the acute phase of infection. The lowest  concentration of SARS-CoV-2 viral copies this assay can detect is 250  copies / mL. A negative result does not preclude SARS-CoV-2 infection  and should not be used as the sole basis for treatment or other  patient management decisions.  A negative result may occur with  improper specimen collection / handling, submission of specimen other  than nasopharyngeal swab, presence of viral mutation(s) within the  areas targeted by this assay, and inadequate number of viral copies  (<250 copies / mL). A negative result must be combined with clinical  observations, patient history, and epidemiological information. If result is POSITIVE SARS-CoV-2 target nucleic acids are DETECTED. The SARS-CoV-2 RNA is generally detectable in upper and lower  respiratory specimens dur ing the acute phase of infection.  Positive  results are indicative of active infection with SARS-CoV-2.  Clinical  correlation with patient history and other diagnostic information is  necessary to  determine patient infection status.  Positive results do  not rule out bacterial infection or co-infection with other viruses. If result is PRESUMPTIVE POSTIVE SARS-CoV-2 nucleic acids MAY BE PRESENT.   A presumptive positive result was obtained on the submitted specimen  and confirmed on repeat testing.  While 2019 novel coronavirus  (SARS-CoV-2) nucleic acids may be present in the submitted sample  additional confirmatory testing may be necessary for epidemiological  and / or clinical management purposes  to differentiate between  SARS-CoV-2 and other Sarbecovirus currently known to infect humans.  If clinically indicated additional testing with an alternate test  methodology [phone]) is advised. The SARS-CoV-2 RNA is generally  detectable in upper and lower respiratory sp ecimens during the acute  phase of infection. The expected result is Negative. Fact Sheet for Patients:  StrictlyIdeas.no Fact Sheet for Healthcare Providers: BankingDealers.co.za This test is not yet approved or cleared by the [place] FDA and has been authorized for detection and/or diagnosis of SARS-CoV-2 by FDA under an Emergency Use Authorization (EUA).  This EUA will remain in effect (meaning this test can be used) for the duration of the COVID-19 declaration under Section 564(b)(1) of the Act, 21 U.S.C. section 360bbb-3(b)(1), unless the authorization is terminated or revoked sooner. Performed at [facility], [address] [place], [place] [address]     RADIOLOGY:  No results found.   Management plans discussed with the patient, family and they are in agreement.  CODE STATUS:     Code Status Orders  (From admission, onward)         Start     Ordered   04/24/19 1444  Full code  Continuous     04/24/19 1443        Code Status History    Date Active Date Inactive Code Status Order ID Comments User Context   04/24/2019 1008  04/24/2019 1443 Full Code [id number]  Merlyn Lot, MD [place]      TOTAL TIME TAKING CARE OF THIS PATIENT: 38 minutes.   This patient was staffed with Dr. Hyman Bible who personally evaluated patient, reviewed documentation and agreed with discharge plan of care as above.  Rufina Falco, DNP, FNP-BC Hospitalist Nurse Practitioner   on 04/26/2019 at 10:47 AM  Between 7am to 6pm - Pager - [phone]  After 6pm go to [email or web address] - Proofreader  [employer]  [phone]  CC: Primary care physician; System, Pcp Not In   Note: This dictation was prepared with Dragon dictation along with smaller phrase technology. Any transcriptional errors that result from this process are unintentional.

## 2019-04-26 NOTE — Discharge Instructions (Signed)
1. Stop Losartan 2. Please take Metoprolol 50 mg twice a day for your blood pressure 3. Continue prednisone for additional 4 days 4. Continue your home Norvasc/Amlodopine 5 mg once a day for blood pressure 5. Follow up with your primary care physician and [facility] in 1 week

## 2019-04-26 NOTE — Progress Notes (Signed)
Pt to be discharged today. Iv and tele removed. disch instructions and prescrips given to pt. Pt's own meds retrieved from pharmacy and returned to him. Pt to be transported by family member.

## 2019-05-29 ENCOUNTER — Other Ambulatory Visit: Payer: Self-pay

## 2019-05-29 ENCOUNTER — Emergency Department: Payer: Medicare (Managed Care)

## 2019-05-29 ENCOUNTER — Emergency Department
Admission: EM | Admit: 2019-05-29 | Discharge: 2019-05-29 | Disposition: A | Discharge status: Home / Self Care | Payer: Medicare (Managed Care) | Attending: Emergency Medicine | Admitting: Emergency Medicine

## 2019-05-29 ENCOUNTER — Encounter: Payer: Self-pay | Admitting: Emergency Medicine

## 2019-05-29 DIAGNOSIS — F17200 Nicotine dependence, unspecified, uncomplicated: Secondary | ICD-10-CM | POA: Diagnosis not present

## 2019-05-29 DIAGNOSIS — I10 Essential (primary) hypertension: Secondary | ICD-10-CM | POA: Diagnosis not present

## 2019-05-29 DIAGNOSIS — M79662 Pain in left lower leg: Secondary | ICD-10-CM | POA: Insufficient documentation

## 2019-05-29 DIAGNOSIS — Z96611 Presence of right artificial shoulder joint: Secondary | ICD-10-CM | POA: Insufficient documentation

## 2019-05-29 DIAGNOSIS — M109 Gout, unspecified: Secondary | ICD-10-CM | POA: Diagnosis not present

## 2019-05-29 DIAGNOSIS — Z96641 Presence of right artificial hip joint: Secondary | ICD-10-CM | POA: Diagnosis not present

## 2019-05-29 DIAGNOSIS — Z79899 Other long term (current) drug therapy: Secondary | ICD-10-CM | POA: Diagnosis not present

## 2019-05-29 DIAGNOSIS — R2242 Localized swelling, mass and lump, left lower limb: Secondary | ICD-10-CM | POA: Diagnosis present

## 2019-05-29 DIAGNOSIS — Z96642 Presence of left artificial hip joint: Secondary | ICD-10-CM | POA: Insufficient documentation

## 2019-05-29 MED ORDER — TRAMADOL HCL 50 MG PO TABS: 50.0000 mg | ORAL_TABLET | Freq: Four times a day (QID) | ORAL | 0 refills | Status: AC | PRN

## 2019-05-29 MED ORDER — PREDNISONE 20 MG PO TABS: ORAL_TABLET | ORAL | 0 refills | Status: AC

## 2019-05-29 MED ORDER — PREDNISONE 20 MG PO TABS
60.0000 mg | ORAL_TABLET | Freq: Once | ORAL | Status: AC
  Administered 2019-05-29: 60 mg via ORAL
  Filled 2019-05-29: qty 3

## 2019-05-29 NOTE — ED Triage Notes (Signed)
Pt to triage via w/c, mask in place; pt reports x 3 days having pain/swelling to left leg/foot/knee/ankle; denies injury but reports hx gout

## 2019-05-29 NOTE — ED Notes (Signed)
Xray at the bedside.

## 2019-05-29 NOTE — ED Provider Notes (Signed)
[facility] Emergency Department Provider Note  ____________________________________________  Time seen: Approximately 9:28 AM  I have reviewed the triage vital signs and the nursing notes.   HISTORY  Chief Complaint Leg Pain    HPI Thomas Tapia is a 53 y.o. male with a history of hypertension and gout and CKD who comes to the [facility] complaining of 3 days of gradual onset worsening pain and swelling of the left knee ankle and foot.  Denies any injury.  No fevers or chills.  No recent wounds.  Pain is severe, constant, worse with ambulation, no alleviating factors, nonradiating.  Feels like prior gout episodes.      Past Medical History:  Diagnosis Date  . Gout   . Hypertension      Patient Active Problem List   Diagnosis Date Noted  . Acute renal failure (ARF) ([place]) 04/24/2019     Past Surgical History:  Procedure Laterality Date  . TOTAL HIP ARTHROPLASTY Bilateral   . TOTAL SHOULDER REPLACEMENT Right      Prior to Admission medications   Medication Sig Start Date End Date Taking? Authorizing Provider  amLODipine (NORVASC) 5 MG tablet Take 1 tablet (5 mg total) by mouth daily. 04/26/19   Lang Snow, NP  folic acid (FOLVITE) 1 MG tablet Take 1 tablet (1 mg total) by mouth daily. 04/26/19   Lang Snow, NP  metoprolol tartrate (LOPRESSOR) 50 MG tablet Take 1 tablet (50 mg total) by mouth 2 (two) times daily. 04/26/19   Lang Snow, NP  Multiple Vitamin (MULTIVITAMIN WITH MINERALS) TABS tablet Take 1 tablet by mouth daily. 04/26/19   Lang Snow, NP  predniSONE (DELTASONE) 20 MG tablet Take 2 tablets (40 mg total) by mouth daily for 4 days, THEN 1 tablet (20 mg total) daily for 4 days, THEN 0.5 tablets (10 mg total) daily for 4 days. 05/29/19 06/10/19  Carrie Mew, MD  thiamine 100 MG tablet Take 1 tablet (100 mg total) by mouth daily. 04/26/19   Lang Snow, NP  traMADol (ULTRAM) 50 MG tablet Take  1 tablet (50 mg total) by mouth every 6 (six) hours as needed. 05/29/19   Carrie Mew, MD     Allergies Patient has no known allergies.   Family History  Problem Relation Age of Onset  . Hypertension Mother     Social History Social History   Tobacco Use  . Smoking status: Current Every Day Smoker  . Smokeless tobacco: Never Used  Substance Use Topics  . Alcohol use: Yes  . Drug use: Never    Review of Systems  Constitutional:   No fever or chills.  ENT:   No sore throat. No rhinorrhea. Cardiovascular:   No chest pain or syncope. Respiratory:   No dyspnea or cough. Gastrointestinal:   Negative for abdominal pain, vomiting and diarrhea.  Musculoskeletal: Left leg pain and swelling as above All other systems reviewed and are negative except as documented above in ROS and HPI.  ____________________________________________   PHYSICAL EXAM:  VITAL SIGNS: [place] Triage Vitals  Enc Vitals Group     BP 05/29/19 0622 140/70     Pulse Rate 05/29/19 0622 69     Resp 05/29/19 0622 18     Temp 05/29/19 0622 98.2 F (36.8 C)     Temp Source 05/29/19 0622 Oral     SpO2 05/29/19 0622 96 %     Weight 05/29/19 0623 230 lb (104.3 kg)     Height  05/29/19 0623 5\' 11"  (1.803 m)     Head Circumference --      Peak Flow --      Pain Score 05/29/19 0615 10     Pain Loc --      Pain Edu? --      Excl. in [place]? --     Vital signs reviewed, nursing assessments reviewed.   Constitutional:   Alert and oriented. Non-toxic appearance. Eyes:   Conjunctivae are normal. EOMI. PERRL. ENT      Head:   Normocephalic and atraumatic.        Neck:   No meningismus. Full ROM. Hematological/Lymphatic/Immunilogical:   No cervical lymphadenopathy. Cardiovascular:   RRR. Symmetric bilateral radial and DP pulses.  No murmurs. Cap refill less than 2 seconds. Respiratory:   Normal respiratory effort without tachypnea/retractions. Breath sounds are clear and equal bilaterally. No  wheezes/rales/rhonchi. Gastrointestinal:   Soft and nontender. Non distended. There is no CVA tenderness.  No rebound, rigidity, or guarding.  Musculoskeletal:   Normal range of motion in all extremities.  Moderate effusion of left knee, intact passive range of motion.  Swelling over the volar midfoot.  Mild tenderness at the left knee as well as left volar foot..  No edema.  No bony point tenderness.  No lymphangitis or crepitus.  No wounds. Neurologic:   Normal speech and language.  Motor grossly intact. No acute focal neurologic deficits are appreciated.  Skin:    Skin is warm, dry and intact. No rash noted.  No petechiae, purpura, or bullae.  ____________________________________________    LABS (pertinent positives/negatives) (all labs ordered are listed, but only abnormal results are displayed) Labs Reviewed - No data to display ____________________________________________   EKG    ____________________________________________    RADIOLOGY  Dg Knee Complete 4 Views Left  Result Date: 05/29/2019 CLINICAL DATA:  Pain and swelling.  Reported history of gout EXAM: LEFT KNEE - COMPLETE 4+ VIEW COMPARISON:  None. FINDINGS: Frontal, lateral, and bilateral oblique views were obtained. There is no evident fracture or dislocation. There is a sizable knee joint effusion. There is no appreciable joint space narrowing. There is slight spurring medially and in the patellofemoral joint regions. No erosive change. IMPRESSION: Sizable joint effusion. Mild spurring medially and in the patellofemoral joint regions. No erosive changes. No fracture or dislocation. Electronically Signed   By: Lowella Grip III M.D.   On: 05/29/2019 08:49   Dg Foot Complete Left  Result Date: 05/29/2019 CLINICAL DATA:  Pain and swelling.  Reported history of gout EXAM: LEFT FOOT - COMPLETE 3+ VIEW COMPARISON:  None. FINDINGS: Frontal, oblique, and lateral views obtained. No evident fracture or dislocation. There  is spurring in the dorsal midfoot region. There is no appreciable joint space narrowing or erosion. There are posterior and inferior calcaneal spurs. 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 There are calcaneal spurs. No appreciable joint space narrowing or erosion. No fracture or dislocation. Electronically Signed   By: Lowella Grip III M.D.   On: 05/29/2019 08:51    ____________________________________________   PROCEDURES Procedures  ____________________________________________    CLINICAL IMPRESSION / ASSESSMENT AND PLAN / [place] COURSE  Medications ordered in the [facility]: Medications  predniSONE (DELTASONE) tablet 60 mg (60 mg Oral Given 05/29/19 0806)    Pertinent labs & imaging results that were available during my care of the patient were reviewed by me and considered in my medical decision making (see chart for details).  Thomas Tapia was evaluated in  Emergency Department on 05/29/2019 for the symptoms described in the history of present illness. He was evaluated in the context of the global COVID-19 pandemic, which necessitated consideration that the patient might be at risk for infection with the SARS-CoV-2 virus that causes COVID-19. Institutional protocols and algorithms that pertain to the evaluation of patients at risk for COVID-19 are in a state of rapid change based on information released by regulatory bodies including the CDC and federal and state organizations. These policies and algorithms were followed during the patient's care in the [facility].     Clinical Course as of May 29 927  Thu May 29, 2019  0807 Patient presents with polyarticular gout flare with pain and swelling of left knee, left ankle, left foot.  Does not appear to be infectious, the calf is normal and nontender.  Doubt DVT necrotizing fasciitis osteomyelitis or septic arthritis.  Intact range of motion passively despite a large knee effusion.  Not a good candidate for NSAIDs.  I will give  prednisone and check x-rays.   [PS]  [id number] X-rays unremarkable.  Plan to discharge home on a course of steroids, follow-up with primary care.   [PS]    Clinical Course User Index [PS] Carrie Mew, MD     ____________________________________________   FINAL CLINICAL IMPRESSION(S) / [place] DIAGNOSES    Final diagnoses:  Gouty arthritis of left knee  Gouty arthritis of left foot  CKD   [facility] Discharge Orders         Ordered    predniSONE (DELTASONE) 20 MG tablet     05/29/19 0927    traMADol (ULTRAM) 50 MG tablet  Every 6 hours PRN     05/29/19 0928          Portions of this note were generated with dragon dictation software. Dictation errors may occur despite best attempts at proofreading.   Carrie Mew, MD 05/29/19 [phone]

## 2019-09-07 IMAGING — DX DG HAND COMPLETE 3+V*L*
3 series · 3 of 3 positions shown · non-contrast
Comparison: None.

CLINICAL DATA: Left hand swelling and pain

EXAM:
LEFT HAND - COMPLETE 3+ VIEW

[hand ap]
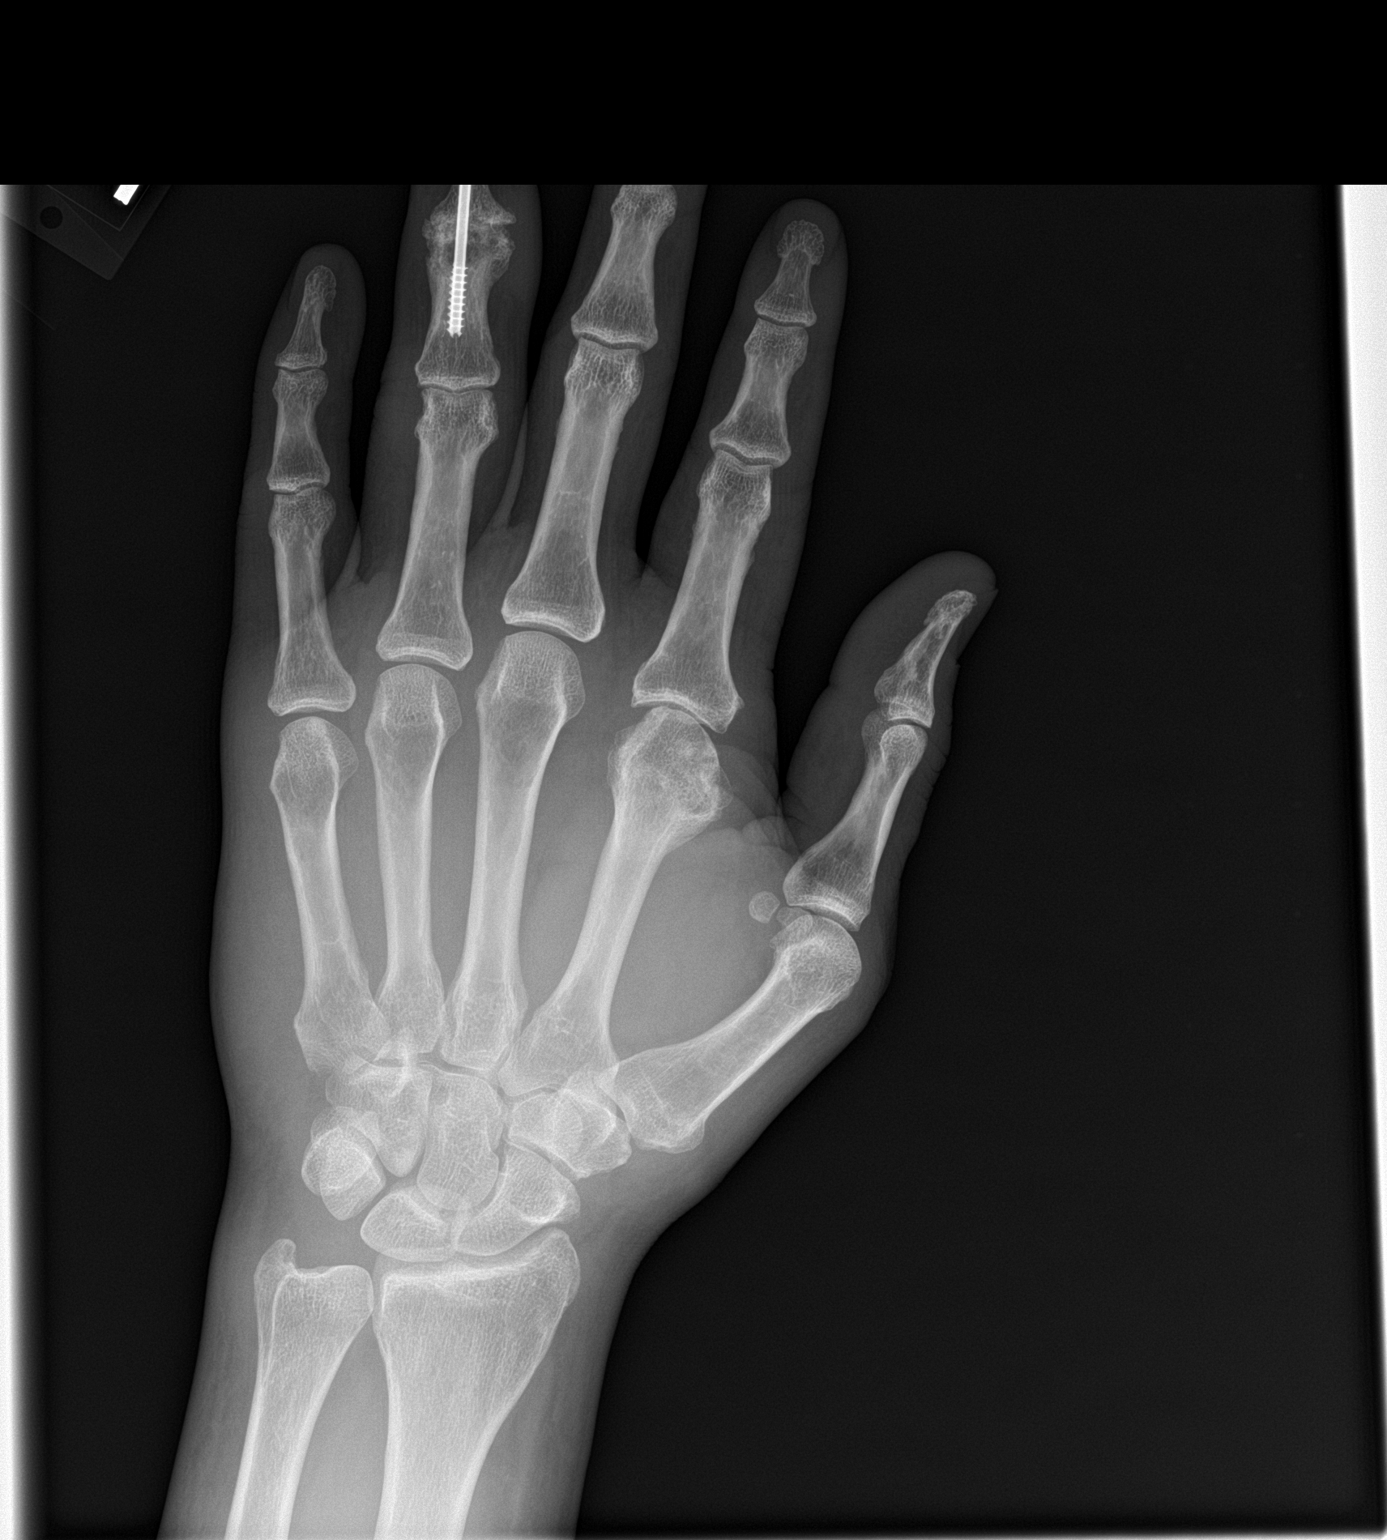

[hand obl]
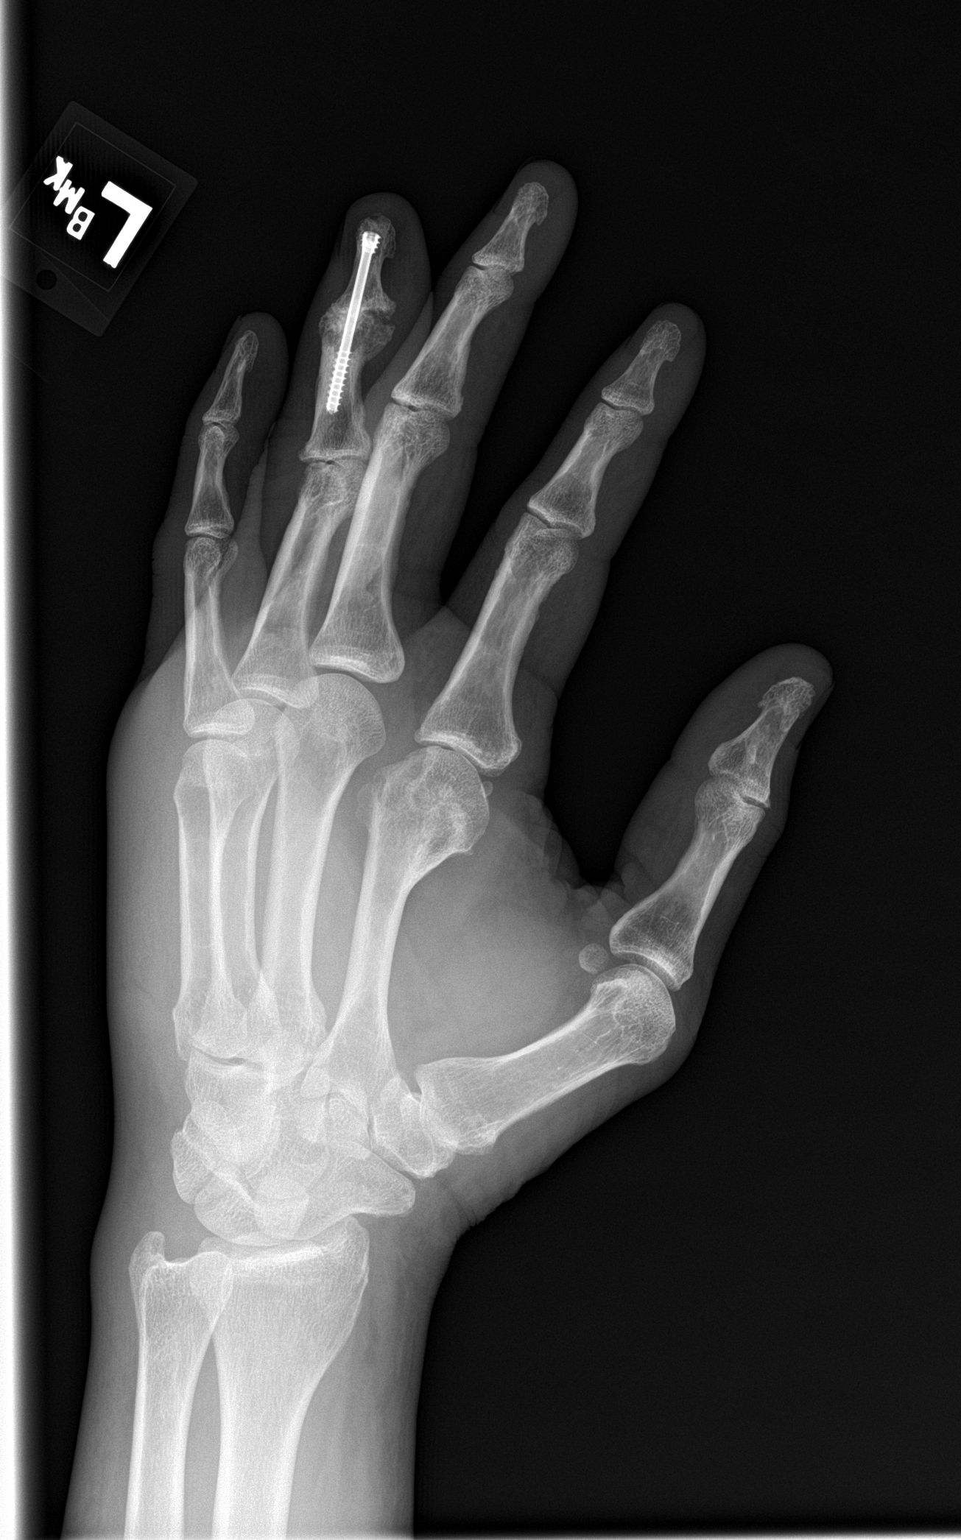

[hand lat]
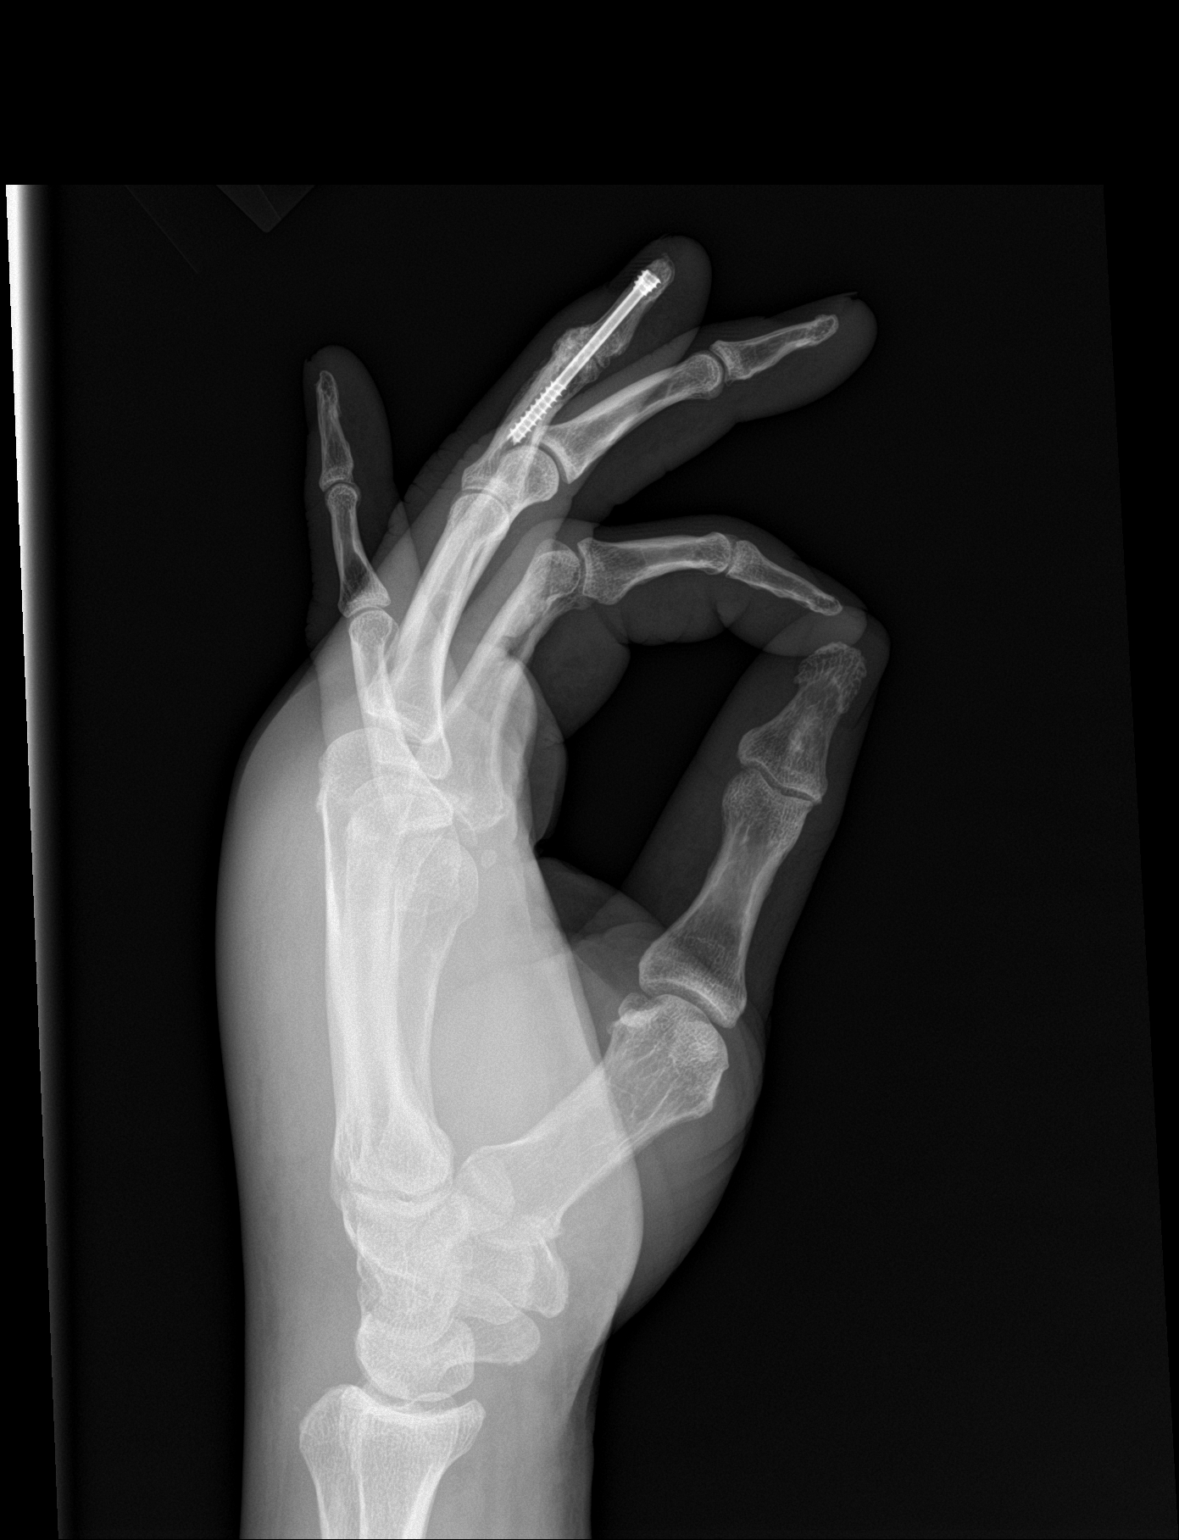

[3 of 3 positions shown; findings below may reference images not displayed]

FINDINGS: Moderate diffuse soft tissue swelling of the dorsum of the wrist and
hand. No frank bone destruction to suggest osteomyelitis. No soft
tissue mineralization or extra-articular erosive change.

Osteoarthritis of the fourth DIP joint with soft tissue swelling.
Fixation screw traverses the fourth DIP joint. Healed fracture
deformity of the head and neck of the left second metacarpal.
Osteoarthritic joint space narrowing of the second MCP. Intact
carpal rows and carpal bones. Distal radius and ulna are nonacute.
Faint lucency at the base of the fourth proximal phalanx is seen.
Although this could potentially represent a nondisplaced fracture,
patient notes no known injury and therefore findings may be
secondary to a trabecular marking.
IMPRESSION: 1. Moderate diffuse soft tissue swelling over the dorsum of the hand
and wrist without underlying fracture or bone destruction.
2. Faint nondisplaced linear lucency at the base of the fourth
proximal phalanx may represent a trabecular marking in the absence
of known injury.
3. Remote healed fracture deformity of the second metacarpal head
and neck with associated degenerative joint space narrowing of the
second MCP.
4. Fixation screw traverses an osteoarthritic fourth DIP joint. No
complicating features are noted. There is mild soft tissue swelling
of the ring finger however.

## 2019-12-29 DIAGNOSIS — Z01818 Encounter for other preprocedural examination: Secondary | ICD-10-CM | POA: Diagnosis not present

## 2020-01-01 DIAGNOSIS — D124 Benign neoplasm of descending colon: Secondary | ICD-10-CM | POA: Diagnosis not present

## 2020-01-01 DIAGNOSIS — K649 Unspecified hemorrhoids: Secondary | ICD-10-CM | POA: Diagnosis not present

## 2020-01-01 DIAGNOSIS — Z1211 Encounter for screening for malignant neoplasm of colon: Secondary | ICD-10-CM | POA: Diagnosis not present

## 2020-01-01 DIAGNOSIS — K648 Other hemorrhoids: Secondary | ICD-10-CM | POA: Diagnosis not present

## 2020-01-01 DIAGNOSIS — K5730 Diverticulosis of large intestine without perforation or abscess without bleeding: Secondary | ICD-10-CM | POA: Diagnosis not present

## 2020-01-01 DIAGNOSIS — K635 Polyp of colon: Secondary | ICD-10-CM | POA: Diagnosis not present

## 2020-01-01 DIAGNOSIS — D125 Benign neoplasm of sigmoid colon: Secondary | ICD-10-CM | POA: Diagnosis not present

## 2020-01-22 DIAGNOSIS — Z8739 Personal history of other diseases of the musculoskeletal system and connective tissue: Secondary | ICD-10-CM | POA: Diagnosis not present

## 2020-01-22 DIAGNOSIS — E119 Type 2 diabetes mellitus without complications: Secondary | ICD-10-CM | POA: Diagnosis not present

## 2020-01-22 DIAGNOSIS — F17200 Nicotine dependence, unspecified, uncomplicated: Secondary | ICD-10-CM | POA: Diagnosis not present

## 2020-01-22 DIAGNOSIS — I10 Essential (primary) hypertension: Secondary | ICD-10-CM | POA: Diagnosis not present

## 2020-01-29 DIAGNOSIS — F17210 Nicotine dependence, cigarettes, uncomplicated: Secondary | ICD-10-CM | POA: Diagnosis not present

## 2020-01-29 DIAGNOSIS — E6609 Other obesity due to excess calories: Secondary | ICD-10-CM | POA: Diagnosis not present

## 2020-01-29 DIAGNOSIS — E1159 Type 2 diabetes mellitus with other circulatory complications: Secondary | ICD-10-CM | POA: Diagnosis not present

## 2020-01-29 DIAGNOSIS — Z6832 Body mass index (BMI) 32.0-32.9, adult: Secondary | ICD-10-CM | POA: Diagnosis not present

## 2020-01-29 DIAGNOSIS — Z8739 Personal history of other diseases of the musculoskeletal system and connective tissue: Secondary | ICD-10-CM | POA: Diagnosis not present

## 2020-01-29 DIAGNOSIS — I152 Hypertension secondary to endocrine disorders: Secondary | ICD-10-CM | POA: Diagnosis not present

## 2020-01-29 DIAGNOSIS — I471 Supraventricular tachycardia: Secondary | ICD-10-CM | POA: Diagnosis not present

## 2020-01-29 DIAGNOSIS — Z79899 Other long term (current) drug therapy: Secondary | ICD-10-CM | POA: Diagnosis not present

## 2020-02-21 ENCOUNTER — Other Ambulatory Visit: Payer: Self-pay

## 2020-02-21 ENCOUNTER — Encounter: Payer: Self-pay | Admitting: *Deleted

## 2020-02-21 ENCOUNTER — Emergency Department
Admission: EM | Admit: 2020-02-21 | Discharge: 2020-02-21 | Disposition: A | Discharge status: Home / Self Care | Payer: Medicare HMO | Attending: Emergency Medicine | Admitting: Emergency Medicine

## 2020-02-21 DIAGNOSIS — Z96611 Presence of right artificial shoulder joint: Secondary | ICD-10-CM | POA: Diagnosis not present

## 2020-02-21 DIAGNOSIS — M109 Gout, unspecified: Secondary | ICD-10-CM | POA: Insufficient documentation

## 2020-02-21 DIAGNOSIS — I10 Essential (primary) hypertension: Secondary | ICD-10-CM | POA: Diagnosis not present

## 2020-02-21 DIAGNOSIS — Z96643 Presence of artificial hip joint, bilateral: Secondary | ICD-10-CM | POA: Insufficient documentation

## 2020-02-21 DIAGNOSIS — M79642 Pain in left hand: Secondary | ICD-10-CM | POA: Diagnosis present

## 2020-02-21 DIAGNOSIS — F17210 Nicotine dependence, cigarettes, uncomplicated: Secondary | ICD-10-CM | POA: Diagnosis not present

## 2020-02-21 LAB — COMPREHENSIVE METABOLIC PANEL
ALT: 40 U/L (ref 0–44)
AST: 29 U/L (ref 15–41)
Albumin: 4.1 g/dL (ref 3.5–5.0)
Alkaline Phosphatase: 62 U/L (ref 38–126)
Anion gap: 10 (ref 5–15)
BUN: 17 mg/dL (ref 6–20)
CO2: 22 mmol/L (ref 22–32)
Calcium: 9.1 mg/dL (ref 8.9–10.3)
Chloride: 101 mmol/L (ref 98–111)
Creatinine, Ser: 1.05 mg/dL (ref 0.61–1.24)
GFR calc Af Amer: >60 mL/min (ref >60)
GFR calc non Af Amer: >60 mL/min (ref >60)
Glucose, Bld: 135 mg/dL — ABNORMAL HIGH (ref 70–99)
Potassium: 3.2 mmol/L — ABNORMAL LOW (ref 3.5–5.1)
Sodium: 133 mmol/L — ABNORMAL LOW (ref 135–145)
Total Bilirubin: 1.4 mg/dL — ABNORMAL HIGH (ref 0.3–1.2)
Total Protein: 8.7 g/dL — ABNORMAL HIGH (ref 6.5–8.1)

## 2020-02-21 LAB — CBC WITH DIFFERENTIAL/PLATELET
Abs Immature Granulocytes: 0.07 10*3/uL (ref 0.00–0.07)
Basophils Absolute: 0 10*3/uL (ref 0.0–0.1)
Basophils Relative: 0 %
Eosinophils Absolute: 0.1 10*3/uL (ref 0.0–0.5)
Eosinophils Relative: 1 %
HCT: 39.8 % (ref 39.0–52.0)
Hemoglobin: 13 g/dL (ref 13.0–17.0)
Immature Granulocytes: 1 %
Lymphocytes Relative: 10 %
Lymphs Abs: 1.3 10*3/uL (ref 0.7–4.0)
MCH: 28 pg (ref 26.0–34.0)
MCHC: 32.7 g/dL (ref 30.0–36.0)
MCV: 85.6 fL (ref 80.0–100.0)
Monocytes Absolute: 1.2 10*3/uL — ABNORMAL HIGH (ref 0.1–1.0)
Monocytes Relative: 9 %
Neutro Abs: 10.2 10*3/uL — ABNORMAL HIGH (ref 1.7–7.7)
Neutrophils Relative %: 79 %
Platelets: 343 10*3/uL (ref 150–400)
RBC: 4.65 MIL/uL (ref 4.22–5.81)
RDW: 13.5 % (ref 11.5–15.5)
WBC: 12.7 10*3/uL — ABNORMAL HIGH (ref 4.0–10.5)
nRBC: 0 % (ref 0.0–0.2)

## 2020-02-21 LAB — URIC ACID: Uric Acid, Serum: 6.2 mg/dL (ref 3.7–8.6)

## 2020-02-21 MED ORDER — COLCHICINE 0.6 MG PO TABS: 1.2000 mg | ORAL_TABLET | Freq: Once | ORAL | 0 refills | Status: AC

## 2020-02-21 MED ORDER — OXYCODONE HCL 5 MG PO TABS: 5.0000 mg | ORAL_TABLET | Freq: Three times a day (TID) | ORAL | 0 refills | Status: AC | PRN

## 2020-02-21 MED ORDER — COLCHICINE 0.6 MG PO TABS
1.2000 mg | ORAL_TABLET | Freq: Once | ORAL | Status: AC
  Administered 2020-02-21: 1.2 mg via ORAL
  Filled 2020-02-21: qty 2

## 2020-02-21 MED ORDER — FENTANYL CITRATE (PF) 100 MCG/2ML IJ SOLN
100.0000 ug | Freq: Once | INTRAMUSCULAR | Status: AC
  Administered 2020-02-21: 100 ug via INTRAVENOUS
  Filled 2020-02-21: qty 2

## 2020-02-21 NOTE — ED Provider Notes (Signed)
Medical screening examination/treatment/procedure(s) were conducted as a shared visit with non-physician practitioner(s) and myself.  I personally evaluated the patient during the encounter.   [facility] Emergency Department Provider Note ____________________________________________  Time seen: Approximately 7:34 PM  I have reviewed the triage vital signs and the nursing notes.   HISTORY  Chief Complaint Arm Swelling    HPI Thomas Tapia is a 54 y.o. male who presents to the emergency department for evaluation and treatment of left hand pain. He states that he feels that it is gout. He missed 2 days of his medication. Pain started in the dorsal aspect of the hand and has progressively worsened. He states this is the "same place it always starts."  Past Medical History:  Diagnosis Date  . Gout   . Hypertension     Patient Active Problem List   Diagnosis Date Noted  . Acute renal failure (ARF) ([place]) 04/24/2019    Past Surgical History:  Procedure Laterality Date  . TOTAL HIP ARTHROPLASTY Bilateral   . TOTAL SHOULDER REPLACEMENT Right     Prior to Admission medications   Medication Sig Start Date End Date Taking? Authorizing Provider  amLODipine (NORVASC) 5 MG tablet Take 1 tablet (5 mg total) by mouth daily. 04/26/19   Lang Snow, NP  colchicine 0.6 MG tablet Take 2 tablets (1.2 mg total) by mouth once for 1 dose. If pain persists after 1 hour, take 1 additional pill. No more than 3 pills in 24 hours. 02/21/20 02/21/20  Triplett, Johnette Abraham B, FNP  folic acid (FOLVITE) 1 MG tablet Take 1 tablet (1 mg total) by mouth daily. 04/26/19   Lang Snow, NP  metoprolol tartrate (LOPRESSOR) 50 MG tablet Take 1 tablet (50 mg total) by mouth 2 (two) times daily. 04/26/19   Lang Snow, NP  Multiple Vitamin (MULTIVITAMIN WITH MINERALS) TABS tablet Take 1 tablet by mouth daily. 04/26/19   Lang Snow, NP  oxyCODONE (ROXICODONE) 5 MG  immediate release tablet Take 1 tablet (5 mg total) by mouth every 8 (eight) hours as needed. 02/21/20 02/20/21  Triplett, Johnette Abraham B, FNP  thiamine 100 MG tablet Take 1 tablet (100 mg total) by mouth daily. 04/26/19   Lang Snow, NP  traMADol (ULTRAM) 50 MG tablet Take 1 tablet (50 mg total) by mouth every 6 (six) hours as needed. 05/29/19   Carrie Mew, MD    Allergies Patient has no known allergies.  Family History  Problem Relation Age of Onset  . Hypertension Mother     Social History Social History   Tobacco Use  . Smoking status: Current Every Day Smoker  . Smokeless tobacco: Never Used  Substance Use Topics  . Alcohol use: Yes  . Drug use: Never    Review of Systems Constitutional: Negative for fever. Cardiovascular: Negative for chest pain. Respiratory: Negative for shortness of breath. Musculoskeletal: Positive for left hand pain. Skin: Positive for left hand pain, erythema, and swelling. Neurological: Negative for decrease in sensation  ____________________________________________   PHYSICAL EXAM:  VITAL SIGNS: Thomas Tapia Triage Vitals  Enc Vitals Group     BP 02/21/20 1856 (!) 156/73     Pulse Rate 02/21/20 1856 76     Resp 02/21/20 1856 18     Temp 02/21/20 1856 99 F (37.2 C)     Temp Source 02/21/20 1856 Oral     SpO2 02/21/20 1856 99 %     Weight 02/21/20 1859 228 lb (103.4 kg)  Height 02/21/20 1859 5\' 11"  (1.803 m)     Head Circumference --      Peak Flow --      Pain Score 02/21/20 1858 10     Pain Loc --      Pain Edu? --      Excl. in [place]? --     Constitutional: Alert and oriented. Well appearing and in no acute distress. Eyes: Conjunctivae are clear without discharge or drainage Head: Atraumatic Neck: Supple. Respiratory: No cough. Respirations are even and unlabored. Musculoskeletal: Diffuse swelling and erythema of the left hand present including fingers extending to the wrist.  No swelling or erythema past the wrist. Neurologic:  Motor and sensory function of the fingers of the left hand is intact. Skin: Erythema noted over the left hand and fingers without open wounds or lesions. Psychiatric: Affect and behavior are appropriate.  ____________________________________________   LABS (all labs ordered are listed, but only abnormal results are displayed)  Labs Reviewed  CBC WITH DIFFERENTIAL/PLATELET - Abnormal; Notable for the following components:      Result Value   WBC 12.7 (*)    Neutro Abs 10.2 (*)    Monocytes Absolute 1.2 (*)    All other components within normal limits  COMPREHENSIVE METABOLIC PANEL - Abnormal; Notable for the following components:   Sodium 133 (*)    Potassium 3.2 (*)    Glucose, Bld 135 (*)    Total Protein 8.7 (*)    Total Bilirubin 1.4 (*)    All other components within normal limits  URIC ACID   ____________________________________________  RADIOLOGY  Not indicated  I, Carrie Mew, personally viewed and evaluated these images (plain radiographs) as part of my medical decision making, as well as reviewing the written report by the radiologist.  No results found. ____________________________________________   PROCEDURES  Procedures  ____________________________________________   INITIAL IMPRESSION / ASSESSMENT AND PLAN / Thomas Tapia COURSE  Thomas Tapia is a 54 y.o. who presents to the emergency department for treatment of left hand pain and swelling after missing his gout medication for the past few days.  Symptoms and exam are consistent with gout flare.  Differential diagnosis includes but is not limited to: Gout, DVT, phlebitis, cellulitis  Yellow band on patient's left hand with left ring finger swelling. He does not want the ring cut/removed. He was advised that if the finger becomes cold, numb, or looses color he will need to have it removed. He says he understands but would like to leave it on for now.   Labs are overall reassuring.  His bilirubin is slightly  elevated 1.4 which appears chronic based on review of previous results. Fentanyl and colchicine ordered. He will be discharged home with prescription for colchicine and a few roxicet.   Patient instructed to follow-up with primary care this coming week.  He was also instructed to return to the emergency department for symptoms that change or worsen if unable schedule an appointment with orthopedics or primary care.  Medications  colchicine tablet 1.2 mg (1.2 mg Oral Given 02/21/20 2019)  fentaNYL (SUBLIMAZE) injection 100 mcg (100 mcg Intravenous Given 02/21/20 2028)    Pertinent labs & imaging results that were available during my care of the patient were reviewed by me and considered in my medical decision making (see chart for details).   _________________________________________   FINAL CLINICAL IMPRESSION(S) / Thomas Tapia DIAGNOSES  Final diagnoses:  Acute gout of left hand, unspecified cause    Thomas Tapia Discharge Orders  Ordered    colchicine 0.6 MG tablet   Once     02/21/20 2046    oxyCODONE (ROXICODONE) 5 MG immediate release tablet  Every 8 hours PRN     02/21/20 2046           If controlled substance prescribed during this visit, 12 month history viewed on the [place] prior to issuing an initial prescription for Schedule II or III opiod.   Carrie Mew, MD 02/21/20 2259

## 2020-02-21 NOTE — ED Provider Notes (Signed)
[facility] Emergency Department Provider Note ____________________________________________  Time seen: Approximately 7:34 PM  I have reviewed the triage vital signs and the nursing notes.   HISTORY  Chief Complaint Arm Swelling    HPI Jaxxon Datema is a 54 y.o. male who presents to the emergency department for evaluation and treatment of left hand pain. He states that he feels that it is gout. He missed 2 days of his medication. Pain started in the dorsal aspect of the hand and has progressively worsened. He states this is the "same place it always starts."  Past Medical History:  Diagnosis Date  . Gout   . Hypertension     Patient Active Problem List   Diagnosis Date Noted  . Acute renal failure (ARF) ([place]) 04/24/2019    Past Surgical History:  Procedure Laterality Date  . TOTAL HIP ARTHROPLASTY Bilateral   . TOTAL SHOULDER REPLACEMENT Right     Prior to Admission medications   Medication Sig Start Date End Date Taking? Authorizing Provider  amLODipine (NORVASC) 5 MG tablet Take 1 tablet (5 mg total) by mouth daily. 04/26/19   Lang Snow, NP  colchicine 0.6 MG tablet Take 2 tablets (1.2 mg total) by mouth once for 1 dose. If pain persists after 1 hour, take 1 additional pill. No more than 3 pills in 24 hours. 02/21/20 02/21/20  Bannon Giammarco, Johnette Abraham B, FNP  folic acid (FOLVITE) 1 MG tablet Take 1 tablet (1 mg total) by mouth daily. 04/26/19   Lang Snow, NP  metoprolol tartrate (LOPRESSOR) 50 MG tablet Take 1 tablet (50 mg total) by mouth 2 (two) times daily. 04/26/19   Lang Snow, NP  Multiple Vitamin (MULTIVITAMIN WITH MINERALS) TABS tablet Take 1 tablet by mouth daily. 04/26/19   Lang Snow, NP  oxyCODONE (ROXICODONE) 5 MG immediate release tablet Take 1 tablet (5 mg total) by mouth every 8 (eight) hours as needed. 02/21/20 02/20/21  Chrishawn Kring, Johnette Abraham B, FNP  thiamine 100 MG tablet Take 1 tablet (100 mg total) by mouth  daily. 04/26/19   Lang Snow, NP  traMADol (ULTRAM) 50 MG tablet Take 1 tablet (50 mg total) by mouth every 6 (six) hours as needed. 05/29/19   Carrie Mew, MD    Allergies Patient has no known allergies.  Family History  Problem Relation Age of Onset  . Hypertension Mother     Social History Social History   Tobacco Use  . Smoking status: Current Every Day Smoker  . Smokeless tobacco: Never Used  Substance Use Topics  . Alcohol use: Yes  . Drug use: Never    Review of Systems Constitutional: Negative for fever. Cardiovascular: Negative for chest pain. Respiratory: Negative for shortness of breath. Musculoskeletal: Positive for left hand pain. Skin: Positive for left hand pain, erythema, and swelling. Neurological: Negative for decrease in sensation  ____________________________________________   PHYSICAL EXAM:  VITAL SIGNS: [place] Triage Vitals  Enc Vitals Group     BP 02/21/20 1856 (!) 156/73     Pulse Rate 02/21/20 1856 76     Resp 02/21/20 1856 18     Temp 02/21/20 1856 99 F (37.2 C)     Temp Source 02/21/20 1856 Oral     SpO2 02/21/20 1856 99 %     Weight 02/21/20 1859 228 lb (103.4 kg)     Height 02/21/20 1859 5\' 11"  (1.803 m)     Head Circumference --      Peak Flow --  Pain Score 02/21/20 1858 10     Pain Loc --      Pain Edu? --      Excl. in [place]? --     Constitutional: Alert and oriented. Well appearing and in no acute distress. Eyes: Conjunctivae are clear without discharge or drainage Head: Atraumatic Neck: Supple. Respiratory: No cough. Respirations are even and unlabored. Musculoskeletal: Diffuse swelling and erythema of the left hand present including fingers extending to the wrist.  No swelling or erythema past the wrist. Neurologic: Motor and sensory function of the fingers of the left hand is intact. Skin: Erythema noted over the left hand and fingers without open wounds or lesions. Psychiatric: Affect and behavior are  appropriate.  ____________________________________________   LABS (all labs ordered are listed, but only abnormal results are displayed)  Labs Reviewed  CBC WITH DIFFERENTIAL/PLATELET - Abnormal; Notable for the following components:      Result Value   WBC 12.7 (*)    Neutro Abs 10.2 (*)    Monocytes Absolute 1.2 (*)    All other components within normal limits  COMPREHENSIVE METABOLIC PANEL - Abnormal; Notable for the following components:   Sodium 133 (*)    Potassium 3.2 (*)    Glucose, Bld 135 (*)    Total Protein 8.7 (*)    Total Bilirubin 1.4 (*)    All other components within normal limits  URIC ACID   ____________________________________________  RADIOLOGY  Not indicated  I, Asmi Fugere, personally viewed and evaluated these images (plain radiographs) as part of my medical decision making, as well as reviewing the written report by the radiologist.  No results found. ____________________________________________   PROCEDURES  Procedures  ____________________________________________   INITIAL IMPRESSION / ASSESSMENT AND PLAN / Terry COURSE  Johntae Harer is a 54 y.o. who presents to the emergency department for treatment of left hand pain and swelling after missing his gout medication for the past few days.  Symptoms and exam are consistent with gout flare.  Differential diagnosis includes but is not limited to: Gout, DVT, phlebitis, cellulitis  Yellow band on patient's left hand with left ring finger swelling. He does not want the ring cut/removed. He was advised that if the finger becomes cold, numb, or looses color he will need to have it removed. He says he understands but would like to leave it on for now.   Labs are overall reassuring.  His bilirubin is slightly elevated 1.4 which appears chronic based on review of previous results. Fentanyl and colchicine ordered. He will be discharged home with prescription for colchicine and a few roxicet.   Patient  instructed to follow-up with primary care this coming week.  He was also instructed to return to the emergency department for symptoms that change or worsen if unable schedule an appointment with orthopedics or primary care.  Medications  colchicine tablet 1.2 mg (1.2 mg Oral Given 02/21/20 2019)  fentaNYL (SUBLIMAZE) injection 100 mcg (100 mcg Intravenous Given 02/21/20 2028)    Pertinent labs & imaging results that were available during my care of the patient were reviewed by me and considered in my medical decision making (see chart for details).   _________________________________________   FINAL CLINICAL IMPRESSION(S) / [place] DIAGNOSES  Final diagnoses:  Acute gout of left hand, unspecified cause    [place] Discharge Orders         Ordered    colchicine 0.6 MG tablet   Once     02/21/20 2046  oxyCODONE (ROXICODONE) 5 MG immediate release tablet  Every 8 hours PRN     02/21/20 2046           If controlled substance prescribed during this visit, 12 month history viewed on the [place] prior to issuing an initial prescription for Schedule II or III opiod.   Victorino Dike, FNP 02/22/20 2115    Carrie Mew, MD 02/25/20 [phone]

## 2020-02-21 NOTE — ED Triage Notes (Signed)
Per patient's report, patient's left arm from hand to elbow started swelling two days ago. Patient states he has a history of gout and didn't take his gout medicine for two days. Left hand is swollen and hot to the touch.

## 2020-02-21 NOTE — ED Notes (Signed)
First Nurse Note: Pt to [facility] via POV c/o left arm pain and swelling in the left hand. Pt has positive pulse in left hand. Pt is in NAD.

## 2020-03-03 DIAGNOSIS — M109 Gout, unspecified: Secondary | ICD-10-CM | POA: Diagnosis not present

## 2020-03-03 DIAGNOSIS — I10 Essential (primary) hypertension: Secondary | ICD-10-CM | POA: Diagnosis not present

## 2020-03-03 DIAGNOSIS — F17210 Nicotine dependence, cigarettes, uncomplicated: Secondary | ICD-10-CM | POA: Diagnosis not present

## 2020-03-05 ENCOUNTER — Ambulatory Visit: Payer: Medicare HMO | Attending: Internal Medicine

## 2020-03-05 DIAGNOSIS — Z23 Encounter for immunization: Secondary | ICD-10-CM

## 2020-03-05 NOTE — Progress Notes (Signed)
° °  Covid-19 Vaccination Clinic  Name:  Thomas Tapia    MRN: [id number] DOB: Mar 20, 1966  03/05/2020  Thomas Tapia was observed post Covid-19 immunization for 15 minutes without incident. He was provided with Vaccine Information Sheet and instruction to access the V-Safe system.   Thomas Tapia was instructed to call 911 with any severe reactions post vaccine:  Difficulty breathing   Swelling of face and throat   A fast heartbeat   A bad rash all over body   Dizziness and weakness   Immunizations Administered    Name Date Dose VIS Date Route   Pfizer COVID-19 Vaccine 03/05/2020  9:44 AM 0.3 mL 11/28/2019 Intramuscular   Manufacturer: [place]   Lot: [id number]   [place]: [id number]

## 2020-03-30 ENCOUNTER — Ambulatory Visit: Payer: Medicare HMO | Attending: Internal Medicine

## 2020-03-30 DIAGNOSIS — Z23 Encounter for immunization: Secondary | ICD-10-CM

## 2020-03-30 NOTE — Progress Notes (Signed)
   Covid-19 Vaccination Clinic  Name:  Thomas Tapia    MRN: [id number] DOB: 10/11/1966  03/30/2020  [name] was observed post Covid-19 immunization for 15 minutes without incident. He was provided with Vaccine Information Sheet and instruction to access the V-Safe system.   [name] was instructed to call 911 with any severe reactions post vaccine: Marland Kitchen Difficulty breathing  . Swelling of face and throat  . A fast heartbeat  . A bad rash all over body  . Dizziness and weakness   Immunizations Administered    Name Date Dose VIS Date Route   Pfizer COVID-19 Vaccine 03/30/2020  9:49 AM 0.3 mL 11/28/2019 Intramuscular   Manufacturer: [place]   Lot: [id number]   [place]: [id number]

## 2020-05-02 IMAGING — DX LEFT FOOT - COMPLETE 3+ VIEW
3 series · 3 of 3 positions shown · non-contrast
Comparison: None.

CLINICAL DATA: Pain and swelling.  Reported history of gout

EXAM:
LEFT FOOT - COMPLETE 3+ VIEW

[foot ap]
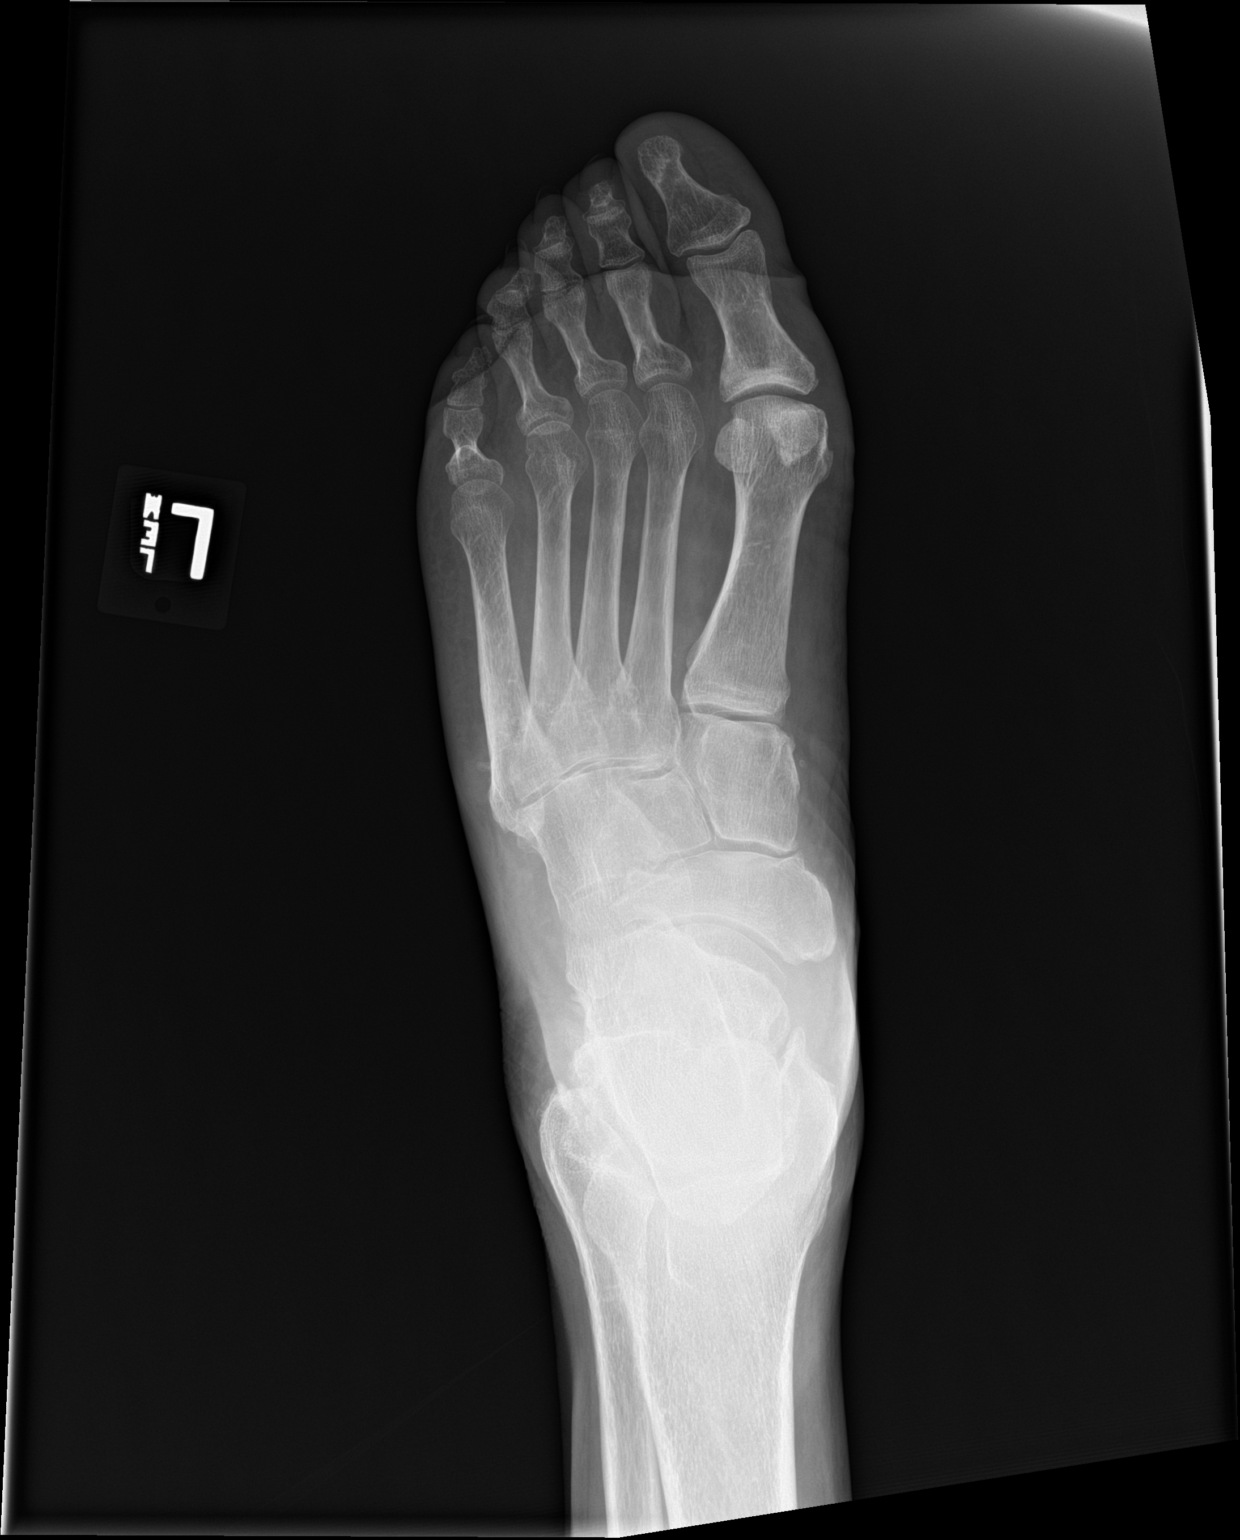

[foot obl]
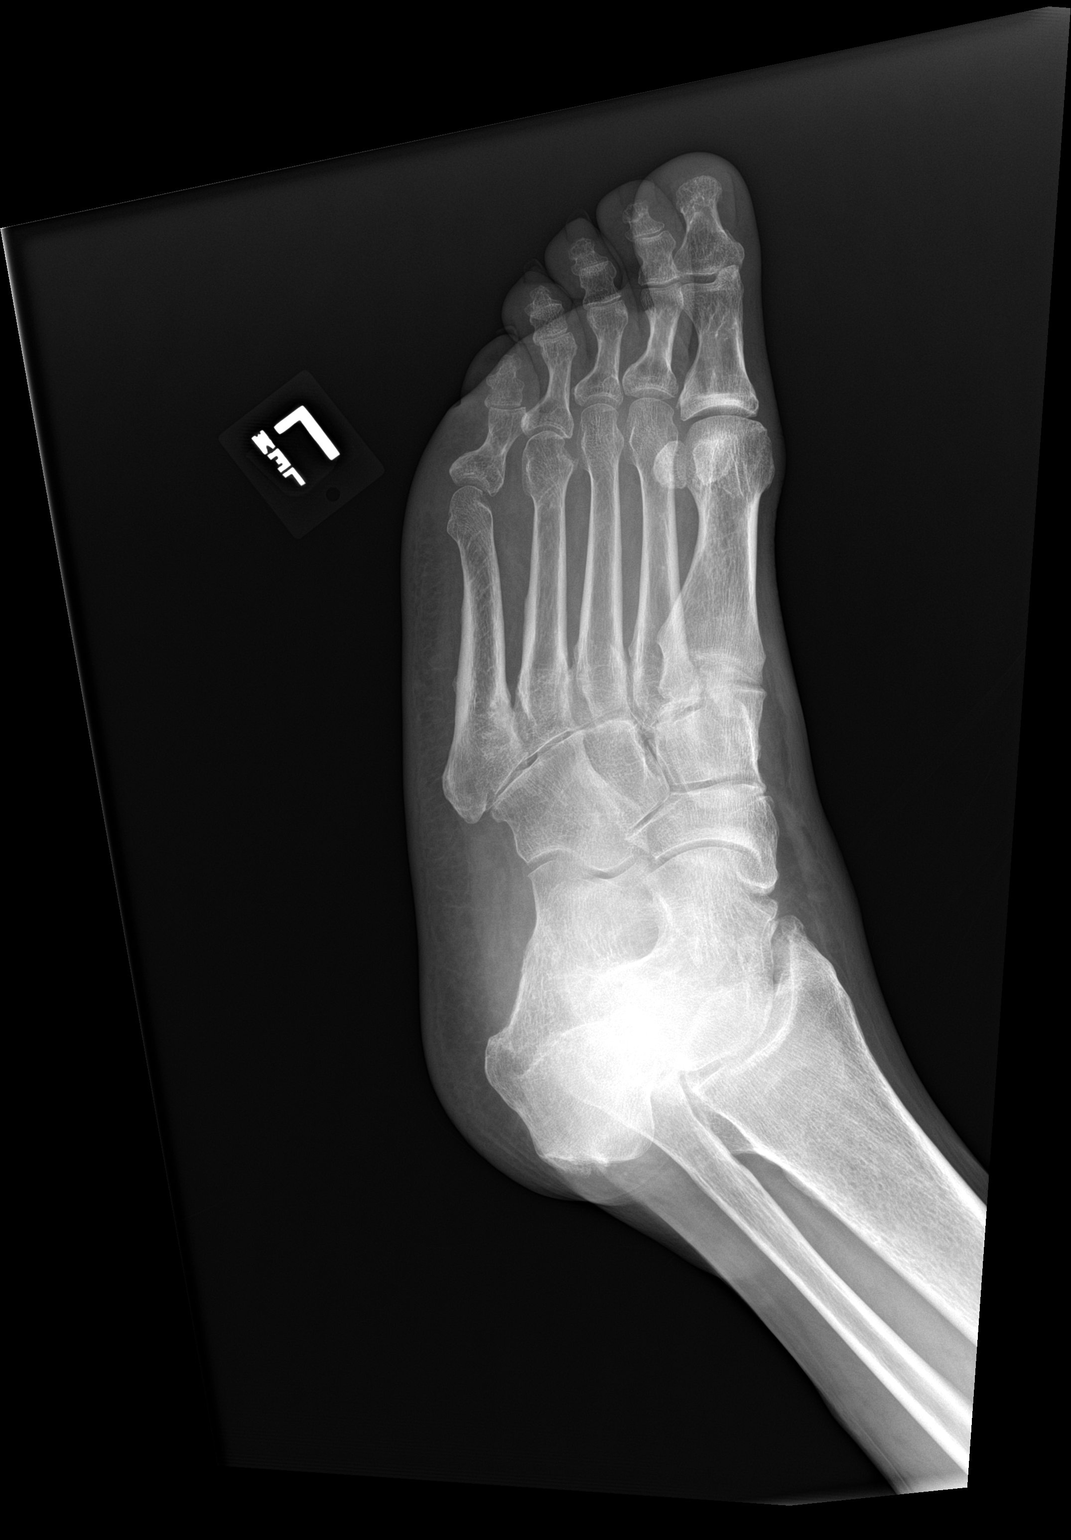

[foot lat]
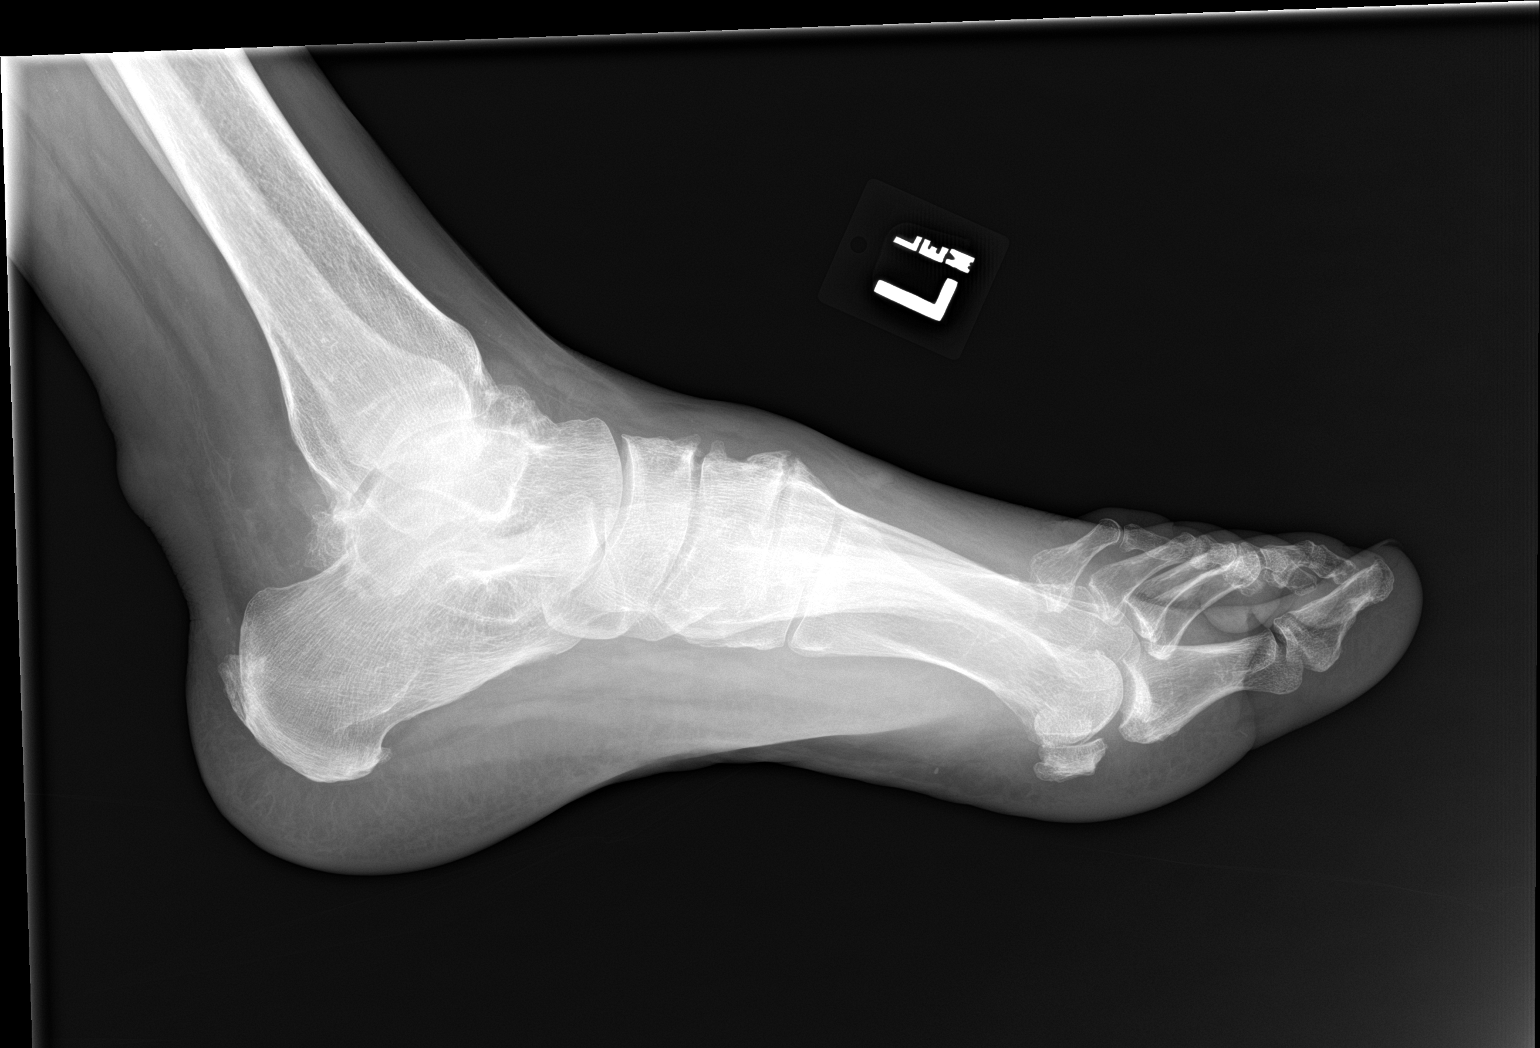

[3 of 3 positions shown; findings below may reference images not displayed]

FINDINGS: Frontal, oblique, and lateral views obtained. No evident fracture or
dislocation. There is spurring in the dorsal midfoot region. There
is no appreciable joint space narrowing or erosion. There are
posterior and inferior calcaneal spurs.
IMPRESSION: Osteoarthritic change in the dorsal midfoot region. There are
calcaneal spurs. No appreciable joint space narrowing or erosion. No
fracture or dislocation.

## 2020-09-10 DIAGNOSIS — Z6832 Body mass index (BMI) 32.0-32.9, adult: Secondary | ICD-10-CM | POA: Diagnosis not present

## 2020-09-10 DIAGNOSIS — F17200 Nicotine dependence, unspecified, uncomplicated: Secondary | ICD-10-CM | POA: Diagnosis not present

## 2020-09-10 DIAGNOSIS — I10 Essential (primary) hypertension: Secondary | ICD-10-CM | POA: Diagnosis not present

## 2020-09-10 DIAGNOSIS — E6609 Other obesity due to excess calories: Secondary | ICD-10-CM | POA: Diagnosis not present

## 2020-09-10 DIAGNOSIS — E119 Type 2 diabetes mellitus without complications: Secondary | ICD-10-CM | POA: Diagnosis not present

## 2020-09-10 DIAGNOSIS — Z8739 Personal history of other diseases of the musculoskeletal system and connective tissue: Secondary | ICD-10-CM | POA: Diagnosis not present

## 2020-09-14 DIAGNOSIS — Z8042 Family history of malignant neoplasm of prostate: Secondary | ICD-10-CM | POA: Diagnosis not present

## 2020-09-14 DIAGNOSIS — F17218 Nicotine dependence, cigarettes, with other nicotine-induced disorders: Secondary | ICD-10-CM | POA: Diagnosis not present

## 2020-09-14 DIAGNOSIS — Z23 Encounter for immunization: Secondary | ICD-10-CM | POA: Diagnosis not present

## 2020-09-14 DIAGNOSIS — Z8739 Personal history of other diseases of the musculoskeletal system and connective tissue: Secondary | ICD-10-CM | POA: Diagnosis not present

## 2020-09-14 DIAGNOSIS — Z136 Encounter for screening for cardiovascular disorders: Secondary | ICD-10-CM | POA: Diagnosis not present

## 2020-09-14 DIAGNOSIS — I10 Essential (primary) hypertension: Secondary | ICD-10-CM | POA: Diagnosis not present

## 2020-09-14 DIAGNOSIS — N522 Drug-induced erectile dysfunction: Secondary | ICD-10-CM | POA: Diagnosis not present

## 2021-01-14 DIAGNOSIS — Z8042 Family history of malignant neoplasm of prostate: Secondary | ICD-10-CM | POA: Diagnosis not present

## 2021-01-14 DIAGNOSIS — Z8739 Personal history of other diseases of the musculoskeletal system and connective tissue: Secondary | ICD-10-CM | POA: Diagnosis not present

## 2021-01-14 DIAGNOSIS — I10 Essential (primary) hypertension: Secondary | ICD-10-CM | POA: Diagnosis not present

## 2021-01-14 DIAGNOSIS — Z125 Encounter for screening for malignant neoplasm of prostate: Secondary | ICD-10-CM | POA: Diagnosis not present

## 2021-01-28 DIAGNOSIS — I471 Supraventricular tachycardia: Secondary | ICD-10-CM | POA: Diagnosis not present

## 2021-01-28 DIAGNOSIS — Z8739 Personal history of other diseases of the musculoskeletal system and connective tissue: Secondary | ICD-10-CM | POA: Diagnosis not present

## 2021-01-28 DIAGNOSIS — Z0000 Encounter for general adult medical examination without abnormal findings: Secondary | ICD-10-CM | POA: Diagnosis not present

## 2021-05-06 DIAGNOSIS — Z8739 Personal history of other diseases of the musculoskeletal system and connective tissue: Secondary | ICD-10-CM | POA: Diagnosis not present

## 2021-05-06 DIAGNOSIS — I10 Essential (primary) hypertension: Secondary | ICD-10-CM | POA: Diagnosis not present

## 2021-05-24 ENCOUNTER — Telehealth: Payer: Self-pay | Admitting: *Deleted

## 2021-05-24 DIAGNOSIS — F17200 Nicotine dependence, unspecified, uncomplicated: Secondary | ICD-10-CM

## 2021-05-24 DIAGNOSIS — Z87891 Personal history of nicotine dependence: Secondary | ICD-10-CM

## 2021-05-24 DIAGNOSIS — Z122 Encounter for screening for malignant neoplasm of respiratory organs: Secondary | ICD-10-CM

## 2021-05-24 NOTE — Telephone Encounter (Addendum)
Received referral for low dose lung cancer screening CT scan. Message left at phone number listed in EMR for patient to call me back to facilitate scheduling scan.   Received referral for initial lung cancer screening scan. Contacted patient and obtained smoking history,(current, 30 pack year) as well as answering questions related to screening process. Patient denies signs of lung cancer such as weight loss or hemoptysis. Patient denies comorbidity that would prevent curative treatment if lung cancer were found. Patient is scheduled for shared decision making visit and CT scan on 06/10/21.

## 2021-05-27 NOTE — Addendum Note (Signed)
Addended by: Lieutenant Diego on: 05/27/2021 11:50 AM   Modules accepted: Orders

## 2021-06-10 ENCOUNTER — Other Ambulatory Visit: Payer: Self-pay

## 2021-06-10 ENCOUNTER — Encounter: Payer: Self-pay | Admitting: Nurse Practitioner

## 2021-06-10 ENCOUNTER — Inpatient Hospital Stay: Payer: Medicare HMO | Attending: Nurse Practitioner | Admitting: Nurse Practitioner

## 2021-06-10 ENCOUNTER — Ambulatory Visit
Admission: RE | Admit: 2021-06-10 | Discharge: 2021-06-10 | Disposition: A | Discharge status: Home / Self Care | Payer: Medicare HMO | Source: Ambulatory Visit | Attending: Oncology | Admitting: Oncology

## 2021-06-10 DIAGNOSIS — F17210 Nicotine dependence, cigarettes, uncomplicated: Secondary | ICD-10-CM | POA: Diagnosis not present

## 2021-06-10 DIAGNOSIS — Z122 Encounter for screening for malignant neoplasm of respiratory organs: Secondary | ICD-10-CM | POA: Insufficient documentation

## 2021-06-10 DIAGNOSIS — Z87891 Personal history of nicotine dependence: Secondary | ICD-10-CM

## 2021-06-10 DIAGNOSIS — F17200 Nicotine dependence, unspecified, uncomplicated: Secondary | ICD-10-CM | POA: Diagnosis not present

## 2021-06-10 NOTE — Progress Notes (Signed)
Virtual Visit via Video Enabled Telemedicine Note   I connected with Tomas Cypert on 06/10/21 at 11:30 AM EST by video enabled telemedicine visit and verified that I am speaking with the correct person using two identifiers.   I discussed the limitations, risks, security and privacy concerns of performing an evaluation and management service by telemedicine and the availability of in-person appointments. I also discussed with the patient that there may be a patient responsible charge related to this service. The patient expressed understanding and agreed to proceed.   Other persons participating in the visit and their role in the encounter: Burgess Estelle, RN- checking in patient & navigation  Patient's location: [place]  Provider's location: Clinic  Chief Complaint: Low Dose CT Screening  Patient agreed to evaluation by telemedicine to discuss shared decision making for consideration of low dose CT lung cancer screening.    In accordance with CMS guidelines, patient has met eligibility criteria including age, absence of signs or symptoms of lung cancer.  Social History   Tobacco Use   Smoking status: Every Day    Pack years: 0.00   Smokeless tobacco: Never  Substance Use Topics   Alcohol use: Yes    Started in 1994, 0.8-1.2 packs per day.   A shared decision-making session was conducted prior to the performance of CT scan. This includes one or more decision aids, includes benefits and harms of screening, follow-up diagnostic testing, over-diagnosis, false positive rate, and total radiation exposure.   Counseling on the importance of adherence to annual lung cancer LDCT screening, impact of co-morbidities, and ability or willingness to undergo diagnosis and treatment is imperative for compliance of the program.   Counseling on the importance of continued smoking cessation for former smokers; the importance of smoking cessation for current smokers, and information about tobacco  cessation interventions have been given to patient including [place] and [employer] [place] programs.   Written order for lung cancer screening with LDCT has been given to the patient and any and all questions have been answered to the best of my abilities.    Yearly follow up will be coordinated by Burgess Estelle, Thoracic Navigator.  I discussed the assessment and treatment plan with the patient. The patient was provided an opportunity to ask questions and all were answered. The patient agreed with the plan and demonstrated an understanding of the instructions.   The patient was advised to call back or seek an in-person evaluation if the symptoms worsen or if the condition fails to improve as anticipated.   I provided 15 minutes of face-to-face video visit time dedicated to the care of this patient on the date of this encounter to include pre-visit review of smoking history, face-to-face time with the patient, and post visit ordering of testing/documentation.   Beckey Rutter, DNP, AGNP-C [place] at [facility] [phone] (clinic)

## 2021-06-21 ENCOUNTER — Telehealth: Payer: Self-pay | Admitting: *Deleted

## 2021-06-21 NOTE — Telephone Encounter (Signed)
Notified patient of LDCT lung cancer screening program results with recommendation for 1s month follow up imaging. Also notified of incidental findings noted below and is encouraged to discuss further with PCP who will receive a copy of this note and/or the CT report. Patient verbalizes understanding.    IMPRESSION: 1. Lung-RADS 1S, negative. Continue annual screening with low-dose chest CT without contrast in 12 months. 2. The "S" modifier above refers to potentially clinically significant non lung cancer related findings. Specifically, there is aortic atherosclerosis, in addition to right coronary artery disease. Please note that although the presence of coronary artery calcium documents the presence of coronary artery disease, the severity of this disease and any potential stenosis cannot be assessed on this non-gated CT examination. Assessment for potential risk factor modification, dietary therapy or pharmacologic therapy may be warranted, if clinically indicated. 3. Mild diffuse bronchial wall thickening with very mild centrilobular and paraseptal emphysema; imaging findings suggestive of underlying COPD.   Aortic Atherosclerosis (ICD10-I70.0) and Emphysema (ICD10-J43.9).

## 2021-07-22 DIAGNOSIS — Z0100 Encounter for examination of eyes and vision without abnormal findings: Secondary | ICD-10-CM | POA: Diagnosis not present

## 2021-07-22 DIAGNOSIS — H52229 Regular astigmatism, unspecified eye: Secondary | ICD-10-CM | POA: Diagnosis not present

## 2021-12-10 DIAGNOSIS — R079 Chest pain, unspecified: Secondary | ICD-10-CM | POA: Diagnosis not present

## 2021-12-10 DIAGNOSIS — R7989 Other specified abnormal findings of blood chemistry: Secondary | ICD-10-CM | POA: Diagnosis not present

## 2021-12-10 DIAGNOSIS — R0602 Shortness of breath: Secondary | ICD-10-CM | POA: Diagnosis not present

## 2021-12-10 DIAGNOSIS — R0789 Other chest pain: Secondary | ICD-10-CM | POA: Diagnosis not present

## 2021-12-10 DIAGNOSIS — I10 Essential (primary) hypertension: Secondary | ICD-10-CM | POA: Diagnosis not present

## 2021-12-10 DIAGNOSIS — R0600 Dyspnea, unspecified: Secondary | ICD-10-CM | POA: Diagnosis not present

## 2021-12-10 DIAGNOSIS — J441 Chronic obstructive pulmonary disease with (acute) exacerbation: Secondary | ICD-10-CM | POA: Diagnosis not present

## 2021-12-10 DIAGNOSIS — Z79899 Other long term (current) drug therapy: Secondary | ICD-10-CM | POA: Diagnosis not present

## 2021-12-10 DIAGNOSIS — J9811 Atelectasis: Secondary | ICD-10-CM | POA: Diagnosis not present

## 2021-12-11 DIAGNOSIS — J9811 Atelectasis: Secondary | ICD-10-CM | POA: Diagnosis not present

## 2021-12-11 DIAGNOSIS — R7989 Other specified abnormal findings of blood chemistry: Secondary | ICD-10-CM | POA: Diagnosis not present

## 2021-12-12 DIAGNOSIS — R0602 Shortness of breath: Secondary | ICD-10-CM | POA: Diagnosis not present

## 2021-12-20 DIAGNOSIS — F17210 Nicotine dependence, cigarettes, uncomplicated: Secondary | ICD-10-CM | POA: Diagnosis not present

## 2021-12-20 DIAGNOSIS — J4540 Moderate persistent asthma, uncomplicated: Secondary | ICD-10-CM | POA: Diagnosis not present

## 2022-01-06 DIAGNOSIS — J4550 Severe persistent asthma, uncomplicated: Secondary | ICD-10-CM | POA: Diagnosis not present

## 2022-01-06 DIAGNOSIS — I10 Essential (primary) hypertension: Secondary | ICD-10-CM | POA: Diagnosis not present

## 2022-01-06 DIAGNOSIS — Z8739 Personal history of other diseases of the musculoskeletal system and connective tissue: Secondary | ICD-10-CM | POA: Diagnosis not present

## 2022-01-06 DIAGNOSIS — F17210 Nicotine dependence, cigarettes, uncomplicated: Secondary | ICD-10-CM | POA: Diagnosis not present

## 2022-01-20 DIAGNOSIS — Z136 Encounter for screening for cardiovascular disorders: Secondary | ICD-10-CM | POA: Diagnosis not present

## 2022-01-20 DIAGNOSIS — I471 Supraventricular tachycardia: Secondary | ICD-10-CM | POA: Diagnosis not present

## 2022-01-20 DIAGNOSIS — F17210 Nicotine dependence, cigarettes, uncomplicated: Secondary | ICD-10-CM | POA: Diagnosis not present

## 2022-01-20 DIAGNOSIS — I10 Essential (primary) hypertension: Secondary | ICD-10-CM | POA: Diagnosis not present

## 2022-01-20 DIAGNOSIS — Z8739 Personal history of other diseases of the musculoskeletal system and connective tissue: Secondary | ICD-10-CM | POA: Diagnosis not present

## 2022-05-08 DIAGNOSIS — F17210 Nicotine dependence, cigarettes, uncomplicated: Secondary | ICD-10-CM | POA: Diagnosis not present

## 2022-05-08 DIAGNOSIS — J4550 Severe persistent asthma, uncomplicated: Secondary | ICD-10-CM | POA: Diagnosis not present

## 2022-06-21 DIAGNOSIS — Z125 Encounter for screening for malignant neoplasm of prostate: Secondary | ICD-10-CM | POA: Diagnosis not present

## 2022-06-21 DIAGNOSIS — I10 Essential (primary) hypertension: Secondary | ICD-10-CM | POA: Diagnosis not present

## 2022-06-21 DIAGNOSIS — Z136 Encounter for screening for cardiovascular disorders: Secondary | ICD-10-CM | POA: Diagnosis not present

## 2022-06-23 DIAGNOSIS — Z720 Tobacco use: Secondary | ICD-10-CM | POA: Diagnosis not present

## 2022-06-23 DIAGNOSIS — I10 Essential (primary) hypertension: Secondary | ICD-10-CM | POA: Diagnosis not present

## 2022-06-23 DIAGNOSIS — Z0000 Encounter for general adult medical examination without abnormal findings: Secondary | ICD-10-CM | POA: Diagnosis not present

## 2022-06-23 DIAGNOSIS — Z1389 Encounter for screening for other disorder: Secondary | ICD-10-CM | POA: Diagnosis not present

## 2022-06-23 DIAGNOSIS — D649 Anemia, unspecified: Secondary | ICD-10-CM | POA: Diagnosis not present

## 2022-07-09 DIAGNOSIS — S72421A Displaced fracture of lateral condyle of right femur, initial encounter for closed fracture: Secondary | ICD-10-CM | POA: Diagnosis not present

## 2022-07-09 DIAGNOSIS — Z79899 Other long term (current) drug therapy: Secondary | ICD-10-CM | POA: Diagnosis not present

## 2022-07-09 DIAGNOSIS — M25461 Effusion, right knee: Secondary | ICD-10-CM | POA: Diagnosis not present

## 2022-07-10 DIAGNOSIS — M25461 Effusion, right knee: Secondary | ICD-10-CM | POA: Diagnosis not present

## 2022-07-10 DIAGNOSIS — M10071 Idiopathic gout, right ankle and foot: Secondary | ICD-10-CM | POA: Diagnosis not present

## 2022-07-10 DIAGNOSIS — S72421A Displaced fracture of lateral condyle of right femur, initial encounter for closed fracture: Secondary | ICD-10-CM | POA: Diagnosis not present

## 2022-07-10 DIAGNOSIS — M79671 Pain in right foot: Secondary | ICD-10-CM | POA: Diagnosis not present

## 2022-07-10 DIAGNOSIS — M10061 Idiopathic gout, right knee: Secondary | ICD-10-CM | POA: Diagnosis not present

## 2022-07-11 ENCOUNTER — Other Ambulatory Visit: Payer: Self-pay

## 2022-07-11 DIAGNOSIS — Z87891 Personal history of nicotine dependence: Secondary | ICD-10-CM

## 2022-07-11 DIAGNOSIS — F17200 Nicotine dependence, unspecified, uncomplicated: Secondary | ICD-10-CM

## 2022-07-11 DIAGNOSIS — M25461 Effusion, right knee: Secondary | ICD-10-CM | POA: Diagnosis not present

## 2022-07-11 DIAGNOSIS — Z122 Encounter for screening for malignant neoplasm of respiratory organs: Secondary | ICD-10-CM

## 2022-07-12 DIAGNOSIS — M10061 Idiopathic gout, right knee: Secondary | ICD-10-CM | POA: Diagnosis not present

## 2022-08-11 ENCOUNTER — Ambulatory Visit: Admission: RE | Admit: 2022-08-11 | Payer: Medicare HMO | Source: Ambulatory Visit

## 2023-04-30 DIAGNOSIS — Z862 Personal history of diseases of the blood and blood-forming organs and certain disorders involving the immune mechanism: Secondary | ICD-10-CM | POA: Diagnosis not present

## 2023-04-30 DIAGNOSIS — I10 Essential (primary) hypertension: Secondary | ICD-10-CM | POA: Diagnosis not present

## 2023-04-30 DIAGNOSIS — F17200 Nicotine dependence, unspecified, uncomplicated: Secondary | ICD-10-CM | POA: Diagnosis not present

## 2023-04-30 DIAGNOSIS — Z8739 Personal history of other diseases of the musculoskeletal system and connective tissue: Secondary | ICD-10-CM | POA: Diagnosis not present

## 2023-10-29 DIAGNOSIS — J4520 Mild intermittent asthma, uncomplicated: Secondary | ICD-10-CM | POA: Diagnosis not present

## 2023-10-29 DIAGNOSIS — F17210 Nicotine dependence, cigarettes, uncomplicated: Secondary | ICD-10-CM | POA: Diagnosis not present

## 2023-10-29 DIAGNOSIS — M25511 Pain in right shoulder: Secondary | ICD-10-CM | POA: Diagnosis not present

## 2023-10-29 DIAGNOSIS — Z96619 Presence of unspecified artificial shoulder joint: Secondary | ICD-10-CM | POA: Diagnosis not present

## 2023-11-13 DIAGNOSIS — Z87891 Personal history of nicotine dependence: Secondary | ICD-10-CM | POA: Diagnosis not present

## 2023-11-13 DIAGNOSIS — N2889 Other specified disorders of kidney and ureter: Secondary | ICD-10-CM | POA: Diagnosis not present

## 2023-11-13 DIAGNOSIS — F17210 Nicotine dependence, cigarettes, uncomplicated: Secondary | ICD-10-CM | POA: Diagnosis not present

## 2023-11-13 DIAGNOSIS — Z122 Encounter for screening for malignant neoplasm of respiratory organs: Secondary | ICD-10-CM | POA: Diagnosis not present

## 2024-08-15 DIAGNOSIS — R21 Rash and other nonspecific skin eruption: Secondary | ICD-10-CM | POA: Diagnosis not present

## 2024-08-15 DIAGNOSIS — M62830 Muscle spasm of back: Secondary | ICD-10-CM | POA: Diagnosis not present

## 2024-08-15 DIAGNOSIS — M545 Low back pain, unspecified: Secondary | ICD-10-CM | POA: Diagnosis not present

## 2024-08-15 DIAGNOSIS — M51369 Other intervertebral disc degeneration, lumbar region without mention of lumbar back pain or lower extremity pain: Secondary | ICD-10-CM | POA: Diagnosis not present

## 2024-08-15 DIAGNOSIS — M25551 Pain in right hip: Secondary | ICD-10-CM | POA: Diagnosis not present

## 2024-08-15 DIAGNOSIS — M5136 Other intervertebral disc degeneration, lumbar region with discogenic back pain only: Secondary | ICD-10-CM | POA: Diagnosis not present

## 2024-08-15 DIAGNOSIS — Z8739 Personal history of other diseases of the musculoskeletal system and connective tissue: Secondary | ICD-10-CM | POA: Diagnosis not present

## 2024-08-15 DIAGNOSIS — Z96643 Presence of artificial hip joint, bilateral: Secondary | ICD-10-CM | POA: Diagnosis not present

## 2024-08-15 DIAGNOSIS — F17210 Nicotine dependence, cigarettes, uncomplicated: Secondary | ICD-10-CM | POA: Diagnosis not present

## 2024-08-15 DIAGNOSIS — M25552 Pain in left hip: Secondary | ICD-10-CM | POA: Diagnosis not present

## 2024-08-15 DIAGNOSIS — Z79899 Other long term (current) drug therapy: Secondary | ICD-10-CM | POA: Diagnosis not present

## 2024-08-15 DIAGNOSIS — I10 Essential (primary) hypertension: Secondary | ICD-10-CM | POA: Diagnosis not present

## 2024-08-31 DIAGNOSIS — M51362 Other intervertebral disc degeneration, lumbar region with discogenic back pain and lower extremity pain: Secondary | ICD-10-CM | POA: Diagnosis not present

## 2024-08-31 DIAGNOSIS — M48061 Spinal stenosis, lumbar region without neurogenic claudication: Secondary | ICD-10-CM | POA: Diagnosis not present

## 2024-08-31 DIAGNOSIS — M5416 Radiculopathy, lumbar region: Secondary | ICD-10-CM | POA: Diagnosis not present

## 2024-08-31 DIAGNOSIS — M545 Low back pain, unspecified: Secondary | ICD-10-CM | POA: Diagnosis not present

## 2024-08-31 DIAGNOSIS — M5116 Intervertebral disc disorders with radiculopathy, lumbar region: Secondary | ICD-10-CM | POA: Diagnosis not present

## 2024-09-27 DIAGNOSIS — M25559 Pain in unspecified hip: Secondary | ICD-10-CM | POA: Diagnosis not present

## 2024-10-12 DIAGNOSIS — M5430 Sciatica, unspecified side: Secondary | ICD-10-CM | POA: Diagnosis not present

## 2024-10-21 DIAGNOSIS — M48062 Spinal stenosis, lumbar region with neurogenic claudication: Secondary | ICD-10-CM | POA: Diagnosis not present
